# Patient Record
Sex: Female | Born: 1985 | Race: White | Hispanic: No | Marital: Single | Smoking: Never smoker
Health system: Southern US, Community
[De-identification: ages and names within clinical notes are randomized; demographics above are authoritative.]

## PROBLEM LIST (undated history)

## (undated) DIAGNOSIS — E119 Type 2 diabetes mellitus without complications: Secondary | ICD-10-CM

## (undated) DIAGNOSIS — N809 Endometriosis, unspecified: Secondary | ICD-10-CM

## (undated) DIAGNOSIS — T8859XA Other complications of anesthesia, initial encounter: Secondary | ICD-10-CM

## (undated) DIAGNOSIS — Z9889 Other specified postprocedural states: Secondary | ICD-10-CM

## (undated) DIAGNOSIS — R112 Nausea with vomiting, unspecified: Secondary | ICD-10-CM

## (undated) DIAGNOSIS — R011 Cardiac murmur, unspecified: Secondary | ICD-10-CM

## (undated) DIAGNOSIS — N200 Calculus of kidney: Secondary | ICD-10-CM

## (undated) DIAGNOSIS — Z8489 Family history of other specified conditions: Secondary | ICD-10-CM

## (undated) DIAGNOSIS — K219 Gastro-esophageal reflux disease without esophagitis: Secondary | ICD-10-CM

## (undated) DIAGNOSIS — T4145XA Adverse effect of unspecified anesthetic, initial encounter: Secondary | ICD-10-CM

## (undated) DIAGNOSIS — D649 Anemia, unspecified: Secondary | ICD-10-CM

## (undated) DIAGNOSIS — I10 Essential (primary) hypertension: Secondary | ICD-10-CM

## (undated) DIAGNOSIS — G43909 Migraine, unspecified, not intractable, without status migrainosus: Secondary | ICD-10-CM

## (undated) HISTORY — PX: CYST EXCISION: SHX5701

## (undated) HISTORY — PX: ABDOMINAL HYSTERECTOMY: SHX81

## (undated) HISTORY — PX: APPENDECTOMY: SHX54

---
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
## 2004-08-18 ENCOUNTER — Observation Stay: Payer: Self-pay | Admitting: Obstetrics and Gynecology

## 2004-10-08 ENCOUNTER — Inpatient Hospital Stay: Payer: Self-pay | Admitting: Obstetrics and Gynecology

## 2004-11-13 ENCOUNTER — Observation Stay: Payer: Self-pay

## 2004-12-10 ENCOUNTER — Inpatient Hospital Stay: Payer: Self-pay | Admitting: Obstetrics and Gynecology

## 2005-01-26 ENCOUNTER — Ambulatory Visit: Payer: Self-pay | Admitting: Urology

## 2005-03-29 ENCOUNTER — Emergency Department: Payer: Self-pay | Admitting: Internal Medicine

## 2005-04-02 ENCOUNTER — Emergency Department: Payer: Self-pay | Admitting: Emergency Medicine

## 2005-04-05 ENCOUNTER — Ambulatory Visit: Payer: Self-pay | Admitting: Obstetrics and Gynecology

## 2005-06-06 ENCOUNTER — Emergency Department: Payer: Self-pay | Admitting: Emergency Medicine

## 2005-07-14 ENCOUNTER — Emergency Department: Payer: Self-pay | Admitting: Emergency Medicine

## 2005-07-15 ENCOUNTER — Ambulatory Visit: Payer: Self-pay | Admitting: Emergency Medicine

## 2005-08-06 ENCOUNTER — Inpatient Hospital Stay: Payer: Self-pay | Admitting: Internal Medicine

## 2005-08-27 ENCOUNTER — Ambulatory Visit: Payer: Self-pay | Admitting: Gastroenterology

## 2005-08-31 ENCOUNTER — Ambulatory Visit: Payer: Self-pay | Admitting: Gastroenterology

## 2005-09-14 ENCOUNTER — Ambulatory Visit: Payer: Self-pay | Admitting: Obstetrics and Gynecology

## 2005-09-22 ENCOUNTER — Emergency Department: Payer: Self-pay | Admitting: Emergency Medicine

## 2005-10-05 ENCOUNTER — Ambulatory Visit: Payer: Self-pay | Admitting: Obstetrics and Gynecology

## 2005-11-12 ENCOUNTER — Ambulatory Visit: Payer: Self-pay | Admitting: Obstetrics and Gynecology

## 2006-01-06 ENCOUNTER — Ambulatory Visit: Payer: Self-pay | Admitting: Anesthesiology

## 2006-01-19 ENCOUNTER — Ambulatory Visit: Payer: Self-pay | Admitting: Anesthesiology

## 2006-02-15 ENCOUNTER — Ambulatory Visit: Payer: Self-pay | Admitting: Anesthesiology

## 2006-03-22 ENCOUNTER — Ambulatory Visit: Payer: Self-pay | Admitting: Anesthesiology

## 2006-05-12 ENCOUNTER — Ambulatory Visit: Payer: Self-pay | Admitting: Anesthesiology

## 2006-10-04 ENCOUNTER — Emergency Department: Payer: Self-pay | Admitting: Internal Medicine

## 2006-10-06 ENCOUNTER — Emergency Department: Payer: Self-pay | Admitting: Emergency Medicine

## 2006-11-15 ENCOUNTER — Emergency Department: Payer: Self-pay | Admitting: General Practice

## 2007-01-23 ENCOUNTER — Emergency Department: Payer: Self-pay | Admitting: Internal Medicine

## 2007-04-22 ENCOUNTER — Emergency Department: Payer: Self-pay | Admitting: Emergency Medicine

## 2007-04-28 ENCOUNTER — Ambulatory Visit (HOSPITAL_BASED_OUTPATIENT_CLINIC_OR_DEPARTMENT_OTHER): Admission: RE | Admit: 2007-04-28 | Discharge: 2007-04-28 | Payer: Self-pay | Admitting: Urology

## 2007-12-03 ENCOUNTER — Emergency Department: Payer: Self-pay | Admitting: Emergency Medicine

## 2008-03-15 ENCOUNTER — Observation Stay: Payer: Self-pay

## 2008-05-21 ENCOUNTER — Ambulatory Visit: Payer: Self-pay | Admitting: Unknown Physician Specialty

## 2008-05-26 ENCOUNTER — Observation Stay: Payer: Self-pay

## 2008-05-27 ENCOUNTER — Observation Stay: Payer: Self-pay

## 2008-06-10 ENCOUNTER — Encounter: Payer: Self-pay | Admitting: Maternal & Fetal Medicine

## 2008-06-17 ENCOUNTER — Encounter: Payer: Self-pay | Admitting: Obstetrics & Gynecology

## 2008-06-24 ENCOUNTER — Encounter: Payer: Self-pay | Admitting: Maternal and Fetal Medicine

## 2008-07-04 ENCOUNTER — Encounter: Payer: Self-pay | Admitting: Maternal & Fetal Medicine

## 2008-07-05 ENCOUNTER — Observation Stay: Payer: Self-pay | Admitting: Unknown Physician Specialty

## 2008-07-06 ENCOUNTER — Observation Stay: Payer: Self-pay

## 2008-07-11 ENCOUNTER — Encounter: Payer: Self-pay | Admitting: Obstetrics and Gynecology

## 2008-07-17 ENCOUNTER — Ambulatory Visit: Payer: Self-pay

## 2008-07-18 ENCOUNTER — Inpatient Hospital Stay: Payer: Self-pay

## 2008-10-09 ENCOUNTER — Ambulatory Visit: Payer: Self-pay

## 2008-10-31 ENCOUNTER — Ambulatory Visit: Payer: Self-pay | Admitting: Internal Medicine

## 2008-12-30 ENCOUNTER — Emergency Department: Payer: Self-pay | Admitting: Emergency Medicine

## 2009-06-18 ENCOUNTER — Emergency Department: Payer: Self-pay

## 2009-07-18 ENCOUNTER — Emergency Department: Payer: Self-pay | Admitting: Internal Medicine

## 2009-08-10 ENCOUNTER — Emergency Department: Payer: Self-pay | Admitting: Unknown Physician Specialty

## 2009-09-16 ENCOUNTER — Ambulatory Visit: Payer: Self-pay | Admitting: Internal Medicine

## 2009-12-02 ENCOUNTER — Emergency Department: Payer: Self-pay | Admitting: Emergency Medicine

## 2010-02-05 ENCOUNTER — Ambulatory Visit: Payer: Self-pay | Admitting: General Practice

## 2010-02-09 ENCOUNTER — Ambulatory Visit: Payer: Self-pay | Admitting: General Practice

## 2010-02-16 ENCOUNTER — Ambulatory Visit: Payer: Self-pay | Admitting: Urology

## 2010-02-19 ENCOUNTER — Ambulatory Visit: Payer: Self-pay | Admitting: Urology

## 2010-02-25 ENCOUNTER — Ambulatory Visit: Payer: Self-pay | Admitting: Urology

## 2010-10-19 ENCOUNTER — Emergency Department: Payer: Self-pay | Admitting: Internal Medicine

## 2010-11-30 ENCOUNTER — Ambulatory Visit: Payer: Self-pay

## 2011-01-26 NOTE — Op Note (Signed)
NAMEJENNIFER, Taylor Mercer                 ACCOUNT NO.:  [id number]   MEDICAL RECORD NO.:  [id number]          PATIENT TYPE:  AMB   LOCATION:  [facility]                         FACILITY:  [facility]   PHYSICIAN:  Jamison Neighbor, M.D.  DATE OF BIRTH:  09-05-1986   DATE OF PROCEDURE:  04/28/2007  DATE OF DISCHARGE:                               OPERATIVE REPORT   SERVICE:  Urology.   PREOPERATIVE DIAGNOSIS:  Interstitial cystitis.   POSTOPERATIVE DIAGNOSIS:  Interstitial cystitis.   PROCEDURE:  1. Cystoscopy.  2. Urethral calibration.  3. Hydrodistention of the bladder.  4. Marcaine and Pyridium installation.  5. Marcaine and Kenalog injection.   SURGEON:  Jamison Neighbor, M.D.   ANESTHESIA:  General.   COMPLICATIONS:  None.   DRAINS:  None.   BRIEF HISTORY:  This 25 year old female has lower urinary symptoms  including urgency, frequency and pelvic pain.  She also does have a past  history of endometriosis and has been evaluated in [facility] by Dr.  Saralyn Pilar.  The patient has had a potassium test which was felt to  positive.  She has never had a cystoscopy.  She is now to undergo  diagnostic cystoscopy and hydrodistention for evaluation of her bladder.  The patient understands the risks and benefits of the procedure and gave  full informed consent.   PROCEDURE:  After successful induction of general anesthesia, the  patient was placed in the dorsal position and prepped with Betadine and  draped in the usual sterile fashion.  Careful bimanual examination  revealed no irregularities of the urethra, specifically no signs a of  urethral diverticulum.  She had no cystocele, rectocele or enterocele.  There were no masses on bimanual exam.  The urethra was calibrated to 75-  [place] with female urethral sounds.  The cystoscope was inserted; the  bladder was carefully inspected.  No tumors or stones could be seen.  Both ureteral orifices were of normal configuration and location.  The  patient did have squamous metaplasia at the base of the bladder, which  was felt to be generally unremarkable.  The bladder was distended at a  pressure of 100 cmH2O for 5 minutes.  When the bladder was drained,  glomerulations could be seen in the bladder consistent with an  interstitial cystitis.  The bladder capacity of 800 mL was somewhat  diminished compared to a normal bladder capacity of 1150, but is better  than the average IC capacity of 161; this certainly is consistent with  the working diagnosis of IC.  The patient had cauterization of some  bleeding areas in the trigone, but did not have massive ulcers  otherwise requiring fulguration.  The cystoscope was removed.  The  patient had Marcaine and Pyridium injected into the bladder.  Marcaine  and Kenalog were injected periurethral.  The patient tolerated the  procedure well and was taken to the recovery room in good condition.      Jamison Neighbor, M.D.  Electronically Signed     RJE/MEDQ  D:  04/28/2007  T:  04/29/2007  Job:  [id number]  cc:   Madelin Headings  Fax: [phone]

## 2011-05-18 ENCOUNTER — Emergency Department: Payer: Self-pay | Admitting: Emergency Medicine

## 2011-06-22 ENCOUNTER — Ambulatory Visit: Payer: Self-pay | Admitting: Internal Medicine

## 2011-06-25 LAB — POCT HEMOGLOBIN-HEMACUE
Hemoglobin: 13.2
Operator id: 133231

## 2011-08-10 ENCOUNTER — Ambulatory Visit: Payer: Self-pay | Admitting: Internal Medicine

## 2011-10-12 DIAGNOSIS — I10 Essential (primary) hypertension: Secondary | ICD-10-CM | POA: Insufficient documentation

## 2012-04-12 ENCOUNTER — Ambulatory Visit: Payer: Self-pay | Admitting: Surgery

## 2012-04-13 ENCOUNTER — Ambulatory Visit: Payer: Self-pay | Admitting: Surgery

## 2012-12-19 ENCOUNTER — Encounter: Payer: Self-pay | Admitting: Urology

## 2013-01-11 ENCOUNTER — Encounter: Payer: Self-pay | Admitting: Urology

## 2013-02-11 ENCOUNTER — Encounter: Payer: Self-pay | Admitting: Urology

## 2013-02-15 ENCOUNTER — Emergency Department: Payer: Self-pay | Admitting: Emergency Medicine

## 2013-03-13 ENCOUNTER — Encounter: Payer: Self-pay | Admitting: Urology

## 2013-06-27 ENCOUNTER — Emergency Department: Payer: Self-pay | Admitting: Emergency Medicine

## 2013-06-27 LAB — COMPREHENSIVE METABOLIC PANEL
Albumin: 3.8 g/dL (ref 3.4–5.0)
Anion Gap: 6 — ABNORMAL LOW (ref 7–16)
BUN: 11 mg/dL (ref 7–18)
Chloride: 102 mmol/L (ref 98–107)
Co2: 29 mmol/L (ref 21–32)
EGFR (Non-African Amer.): >60
Glucose: 105 mg/dL — ABNORMAL HIGH (ref 65–99)
Osmolality: 274 (ref 275–301)
Potassium: 3.5 mmol/L (ref 3.5–5.1)
SGPT (ALT): 28 U/L (ref 12–78)

## 2013-06-27 LAB — CBC
HCT: 41 % (ref 35.0–47.0)
HGB: 14.8 g/dL (ref 12.0–16.0)
MCHC: 36.2 g/dL — ABNORMAL HIGH (ref 32.0–36.0)
MCV: 84 fL (ref 80–100)
Platelet: 194 10*3/uL (ref 150–440)
RDW: 13.3 % (ref 11.5–14.5)

## 2013-07-02 LAB — CULTURE, BLOOD (SINGLE)

## 2013-09-10 ENCOUNTER — Ambulatory Visit: Payer: Self-pay | Admitting: Physician Assistant

## 2013-09-13 ENCOUNTER — Emergency Department: Payer: Self-pay | Admitting: Emergency Medicine

## 2014-01-21 ENCOUNTER — Emergency Department: Payer: Self-pay | Admitting: Emergency Medicine

## 2014-01-21 LAB — URINALYSIS, COMPLETE
Bacteria: NONE SEEN
Bilirubin,UR: NEGATIVE
Blood: NEGATIVE
GLUCOSE, UR: NEGATIVE mg/dL (ref 0–75)
Leukocyte Esterase: NEGATIVE
NITRITE: NEGATIVE
PH: 5 (ref 4.5–8.0)
RBC,UR: <1 /HPF (ref 0–5)
Specific Gravity: 1.023 (ref 1.003–1.030)
WBC UR: 8 /HPF (ref 0–5)

## 2014-02-07 DIAGNOSIS — S39012A Strain of muscle, fascia and tendon of lower back, initial encounter: Secondary | ICD-10-CM | POA: Insufficient documentation

## 2014-03-27 ENCOUNTER — Ambulatory Visit: Payer: Self-pay | Admitting: Nurse Practitioner

## 2014-04-05 ENCOUNTER — Emergency Department: Payer: Self-pay | Admitting: Internal Medicine

## 2014-06-28 ENCOUNTER — Ambulatory Visit: Payer: Self-pay | Admitting: Family Medicine

## 2014-09-27 ENCOUNTER — Emergency Department: Payer: Self-pay | Admitting: Emergency Medicine

## 2015-02-07 ENCOUNTER — Encounter: Payer: Self-pay | Admitting: Emergency Medicine

## 2015-02-07 ENCOUNTER — Ambulatory Visit
Admission: EM | Admit: 2015-02-07 | Discharge: 2015-02-07 | Disposition: A | Discharge status: Home / Self Care | Payer: BLUE CROSS/BLUE SHIELD | Attending: Family Medicine | Admitting: Family Medicine

## 2015-02-07 DIAGNOSIS — H109 Unspecified conjunctivitis: Secondary | ICD-10-CM | POA: Diagnosis not present

## 2015-02-07 DIAGNOSIS — I10 Essential (primary) hypertension: Secondary | ICD-10-CM

## 2015-02-07 DIAGNOSIS — M778 Other enthesopathies, not elsewhere classified: Secondary | ICD-10-CM

## 2015-02-07 DIAGNOSIS — H65111 Acute and subacute allergic otitis media (mucoid) (sanguinous) (serous), right ear: Secondary | ICD-10-CM

## 2015-02-07 HISTORY — DX: Calculus of kidney: N20.0

## 2015-02-07 HISTORY — DX: Essential (primary) hypertension: I10

## 2015-02-07 HISTORY — DX: Migraine, unspecified, not intractable, without status migrainosus: G43.909

## 2015-02-07 MED ORDER — ERYTHROMYCIN 5 MG/GM OP OINT: TOPICAL_OINTMENT | OPHTHALMIC | Status: DC

## 2015-02-07 MED ORDER — AMOXICILLIN-POT CLAVULANATE 875-125 MG PO TABS: 1.0000 | ORAL_TABLET | Freq: Two times a day (BID) | ORAL | Status: DC

## 2015-02-07 MED ORDER — CHLORTHALIDONE 25 MG PO TABS: 25.0000 mg | ORAL_TABLET | Freq: Every day | ORAL | Status: DC

## 2015-02-07 NOTE — ED Provider Notes (Signed)
CSN: [id number]     Arrival date & time 02/07/15  0902 History   First MD Initiated Contact with Patient 02/07/15 1111     Chief Complaint  Patient presents with  . Eye Drainage    right eye  . Itchy Eye    right eye   (Consider location/radiation/quality/duration/timing/severity/associated sxs/prior Treatment) HPI Comments: Caucasian female here today for pink eye/discharge right eye, blurry vision right eye, itching worsening over the past 3 days.  Eye was matted shut this am.  She has been helping her mother to move unexpectedly this week.  Repetitive movements/lifting heavy items and right elbow has begun to hurt her also.  Bought OTC brace and wondering if she should use it all the time.  [facility] [facility] [place] [facility] and first available appt end of June and patient has run out of her blood pressure medication and needs refill.  Denied headache, dizzyness, nausea, vomiting, diarrhea.  Son has been sick, stressed trying to help her mother move.  The history is provided by the patient.    Past Medical History  Diagnosis Date  . Hypertension   . Kidney stones   . Migraine    Past Surgical History  Procedure Laterality Date  . Cesarean section    . Abdominal hysterectomy    . Appendectomy     Family History  Problem Relation Age of Onset  . Hypertension Mother   . Cancer Father    History  Substance Use Topics  . Smoking status: Never Smoker   . Smokeless tobacco: Never Used  . Alcohol Use: No   OB History    No data available     Review of Systems  Constitutional: Negative for fever, chills, diaphoresis, activity change, appetite change, fatigue and unexpected weight change.  HENT: Negative for congestion, dental problem, drooling, ear discharge, ear pain, facial swelling, hearing loss, mouth sores, nosebleeds, postnasal drip, rhinorrhea, sinus pressure, sneezing, sore throat, tinnitus, trouble swallowing and voice change.   Eyes: Positive for discharge,  redness, itching and visual disturbance. Negative for photophobia and pain.  Respiratory: Negative for cough, chest tightness, shortness of breath, wheezing and stridor.   Cardiovascular: Negative for chest pain, palpitations and leg swelling.  Gastrointestinal: Negative for nausea, abdominal pain, diarrhea, constipation, blood in stool and abdominal distention.  Endocrine: Negative for cold intolerance and heat intolerance.  Genitourinary: Negative for dysuria and difficulty urinating.  Musculoskeletal: Positive for arthralgias. Negative for myalgias, back pain, joint swelling, gait problem, neck pain and neck stiffness.  Skin: Negative for color change, pallor, rash and wound.  Allergic/Immunologic: Positive for environmental allergies. Negative for food allergies.  Neurological: Negative for tremors, seizures, syncope, speech difficulty, weakness, light-headedness, numbness and headaches.  Hematological: Negative for adenopathy. Does not bruise/bleed easily.  Psychiatric/Behavioral: Negative for behavioral problems, confusion, sleep disturbance and agitation.    Allergies  Review of patient's allergies indicates no known allergies.  Home Medications   Prior to Admission medications   Medication Sig Start Date End Date Taking? Authorizing Provider  fluticasone (FLONASE) 50 MCG/ACT nasal spray Place 1 spray into both nostrils daily.   Yes Historical Provider, MD  amoxicillin-clavulanate (AUGMENTIN) 875-125 MG per tablet Take 1 tablet by mouth every 12 (twelve) hours. 02/07/15   Barbaraann Barthelina A Iven Earnhart, NP  chlorthalidone (HYGROTON) 25 MG tablet Take 1 tablet (25 mg total) by mouth daily. 02/07/15   Barbaraann Barthelina A Kane Kusek, NP  erythromycin ophthalmic ointment Place a 1/2 inch ribbon of ointment into the lower  right eyelid for 14 days twice a day. 02/07/15   Jarold Song Jasmen Emrich, NP   BP 163/120 mmHg  Pulse 104  Temp(Src) 97.5 F (36.4 C) (Tympanic)  Resp 16  Ht  (1.626 m)  Wt 215 lb (97.523 kg)   BMI 36.89 kg/m2  SpO2 100% Physical Exam  Constitutional: She is oriented to person, place, and time. Vital signs are normal. She appears well-developed and well-nourished.  HENT:  Head: Normocephalic and atraumatic.  Right Ear: Hearing, tympanic membrane, external ear and ear canal normal.  Left Ear: Hearing and external ear normal. Tympanic membrane is erythematous. A middle ear effusion is present.  Nose: Nose normal.  Mouth/Throat: Oropharynx is clear and moist. No oropharyngeal exudate.  Left TM erythema/air fluid level cloudy external canal with erythema dry; right TM air fluid level clear  Eyes: EOM and lids are normal. Pupils are equal, round, and reactive to light. Right eye exhibits no chemosis, no discharge, no exudate and no hordeolum. No foreign body present in the right eye. Left eye exhibits no chemosis, no discharge, no exudate and no hordeolum. No foreign body present in the left eye. Right conjunctiva is injected. Right conjunctiva has no hemorrhage. Left conjunctiva is injected. Left conjunctiva has no hemorrhage. No scleral icterus. Right eye exhibits normal extraocular motion and no nystagmus. Left eye exhibits normal extraocular motion and no nystagmus. Right pupil is round and reactive. Left pupil is round and reactive. Pupils are equal.    Neck: Trachea normal and normal range of motion. Neck supple. No JVD present. No tracheal deviation present.  Cardiovascular: Normal rate, regular rhythm, normal heart sounds and intact distal pulses.  Exam reveals no gallop and no friction rub.   No murmur heard. Pulmonary/Chest: Effort normal and breath sounds normal. No respiratory distress. She has no wheezes. She has no rales. She exhibits no tenderness.  Abdominal: Soft. Bowel sounds are normal. She exhibits no distension and no mass. There is no tenderness. There is no rebound and no guarding.  Musculoskeletal: Normal range of motion. She exhibits tenderness. She exhibits no edema.        Right elbow: She exhibits normal range of motion, no swelling, no effusion, no deformity and no laceration. Tenderness found. Lateral epicondyle tenderness noted. No radial head, no medial epicondyle and no olecranon process tenderness noted.       Arms: Lymphadenopathy:    She has no cervical adenopathy.  Neurological: She is alert and oriented to person, place, and time. She has normal reflexes.  Skin: Skin is warm, dry and intact. No rash noted. No erythema. No pallor.  Psychiatric: She has a normal mood and affect. Her speech is normal and behavior is normal. Judgment and thought content normal. Cognition and memory are normal.  Nursing note and vitals reviewed.   ED Course  Procedures (including critical care time) Labs Review Labs Reviewed - No data to display  Imaging Review No results found.   MDM   1. Essential hypertension   2. Conjunctivitis of right eye   3. Acute allergic otitis media of right ear, recurrence not specified   4. Right elbow tendonitis    Restart chlorthlidone  po daily.  Schedule initial appt with new PCM.  Follow up with me/emergency room if headache, shortness of breath, chest pain, swelling of hands/feet, visual changes prior to [facility] appt.   Continue to monitor blood pressure at home and maintain log of blood pressure and pulse to bring to follow up appointments.  Continue low sodium diet and exercise program.  Recommended weight loss/weight maintenance to BMI 20-25.  Return to the clinic if any new symptoms.  Patient verbalized agreement and understanding of treatment plan and had no further questions at this time.   P2:  Diet and Exercise specific for HTN Work excuse for today as unsure if allergic versus bacterial conjunctivitis.  Will treat with erythromycin opthalmic right eye BID x 14 days.  Continue OTC allergy eye gtts prn or refresh gtts while awake prn.    Hygiene discussed. Patient to apply warm packs prn as directed.  Instructed  patient to not rub eyes.  May need to wash pillowcases more frequently until infection resolves.  May use over the counter eye drops/tears for pain/symptom relief.  Return to clinic if headache, fever greater than 100.40F, nausea/vomiting, purulent discharge/matting unable to open eye without using fingers after 24 hours of medication use, foreign body sensation, ciliary flush, worsening photophobia or vision.  Call or return to clinic as needed if these symptoms worsen or fail to improve as anticipated.  Patient given [employer] handout on bacterial conjunctivitis.  Patient verbalized agreement and understanding of treatment plan.  P2:  Hand washing, avoid contact use-wear glasses. Given augmentin paper prescription as patient not having discomfort at this time but if pain occurs to start antibiotic this weekend.  Treatment as ordered.  Symptomatic therapy suggested fluids, NSAIDs and rest.  Call or return to clinic as needed if these symptoms worsen or fail to improve as anticipated. [employer] handout on otitis media given to patient.  Patient verbalized agreement and understanding of treatment plan.   P2:  Hand washing Patient given [employer] handout on elbow tendonitis.  Repetitive motion injury.  Discussed work restrictions may restart using brace she bought.  Try to limit repetitive motions at home and work.  Discussed decreasing total pounds of weights being used (do not increase more than 10% per week either total pounds or repetitions), and icing.  Patient will schedule follow up with [facility] for Physical Therapy referral if needed  Discussed tylenol  po QID recommended as does not worsen elevated BP but motrin/naproxen can.  Follow up with PCM if no improvement in symptoms with plan of care.  Will consider orthopedics and x-rays if fails physical therapy.  Patient verbalized understanding of instructions, agreed with plan of care and had no further questions at this time.    Barbaraann Barthel,  NP 02/07/15 1725

## 2015-02-07 NOTE — Discharge Instructions (Signed)
Conjunctivitis Conjunctivitis is commonly called "pink eye." Conjunctivitis can be caused by bacterial or viral infection, allergies, or injuries. There is usually redness of the lining of the eye, itching, discomfort, and sometimes discharge. There may be deposits of matter along the eyelids. A viral infection usually causes a watery discharge, while a bacterial infection causes a yellowish, thick discharge. Pink eye is very contagious and spreads by direct contact. You may be given antibiotic eyedrops as part of your treatment. Before using your eye medicine, remove all drainage from the eye by washing gently with warm water and cotton balls. Continue to use the medication until you have awakened 2 mornings in a row without discharge from the eye. Do not rub your eye. This increases the irritation and helps spread infection. Use separate towels from other household members. Wash your hands with soap and water before and after touching your eyes. Use cold compresses to reduce pain and sunglasses to relieve irritation from light. Do not wear contact lenses or wear eye makeup until the infection is gone. SEEK MEDICAL CARE IF:   Your symptoms are not better after 3 days of treatment.  You have increased pain or trouble seeing.  The outer eyelids become very red or swollen. Document Released: 10/07/2004 Document Revised: 11/22/2011 Document Reviewed: 08/30/2005 [facility] Patient Information 2015 [place]. This information is not intended to replace advice given to you by your health care provider. Make sure you discuss any questions you have with your health care provider. Otitis Media Otitis media is redness, soreness, and inflammation of the middle ear. Otitis media may be caused by allergies or, most commonly, by infection. Often it occurs as a complication of the common cold. SIGNS AND SYMPTOMS Symptoms of otitis media may include:  Earache.  Fever.  Ringing in your  ear.  Headache.  Leakage of fluid from the ear. DIAGNOSIS To diagnose otitis media, your health care provider will examine your ear with an otoscope. This is an instrument that allows your health care provider to see into your ear in order to examine your eardrum. Your health care provider also will ask you questions about your symptoms. TREATMENT  Typically, otitis media resolves on its own within 3-5 days. Your health care provider may prescribe medicine to ease your symptoms of pain. If otitis media does not resolve within 5 days or is recurrent, your health care provider may prescribe antibiotic medicines if he or she suspects that a bacterial infection is the cause. HOME CARE INSTRUCTIONS   If you were prescribed an antibiotic medicine, finish it all even if you start to feel better.  Take medicines only as directed by your health care provider.  Keep all follow-up visits as directed by your health care provider. SEEK MEDICAL CARE IF:  You have otitis media only in one ear, or bleeding from your nose, or both.  You notice a lump on your neck.  You are not getting better in 3-5 days.  You feel worse instead of better. SEEK IMMEDIATE MEDICAL CARE IF:   You have pain that is not controlled with medicine.  You have swelling, redness, or pain around your ear or stiffness in your neck.  You notice that part of your face is paralyzed.  You notice that the bone behind your ear (mastoid) is tender when you touch it. MAKE SURE YOU:   Understand these instructions.  Will watch your condition.  Will get help right away if you are not doing well or get worse.  Document Released: 06/04/2004 Document Revised: 01/14/2014 Document Reviewed: 03/27/2013 [facility] Patient Information 2015 [place], [place]. This information is not intended to replace advice given to you by your health care provider. Make sure you discuss any questions you have with your health care  provider. Hypertension Hypertension, commonly called high blood pressure, is when the force of blood pumping through your arteries is too strong. Your arteries are the blood vessels that carry blood from your heart throughout your body. A blood pressure reading consists of a higher number over a lower number, such as 110/72. The higher number (systolic) is the pressure inside your arteries when your heart pumps. The lower number (diastolic) is the pressure inside your arteries when your heart relaxes. Ideally you want your blood pressure below 120/80. Hypertension forces your heart to work harder to pump blood. Your arteries may become narrow or stiff. Having hypertension puts you at risk for heart disease, stroke, and other problems.  RISK FACTORS Some risk factors for high blood pressure are controllable. Others are not.  Risk factors you cannot control include:   Race. You may be at higher risk if you are African American.  Age. Risk increases with age.  Gender. Men are at higher risk than women before age 82 years. After age 48, women are at higher risk than men. Risk factors you can control include:  Not getting enough exercise or physical activity.  Being overweight.  Getting too much fat, sugar, calories, or salt in your diet.  Drinking too much alcohol. SIGNS AND SYMPTOMS Hypertension does not usually cause signs or symptoms. Extremely high blood pressure (hypertensive crisis) may cause headache, anxiety, shortness of breath, and nosebleed. DIAGNOSIS  To check if you have hypertension, your health care provider will measure your blood pressure while you are seated, with your arm held at the level of your heart. It should be measured at least twice using the same arm. Certain conditions can cause a difference in blood pressure between your right and left arms. A blood pressure reading that is higher than normal on one occasion does not mean that you need treatment. If one blood  pressure reading is high, ask your health care provider about having it checked again. TREATMENT  Treating high blood pressure includes making lifestyle changes and possibly taking medicine. Living a healthy lifestyle can help lower high blood pressure. You may need to change some of your habits. Lifestyle changes may include:  Following the DASH diet. This diet is high in fruits, vegetables, and whole grains. It is low in salt, red meat, and added sugars.  Getting at least 2 hours of brisk physical activity every week.  Losing weight if necessary.  Not smoking.  Limiting alcoholic beverages.  Learning ways to reduce stress. If lifestyle changes are not enough to get your blood pressure under control, your health care provider may prescribe medicine. You may need to take more than one. Work closely with your health care provider to understand the risks and benefits. HOME CARE INSTRUCTIONS  Have your blood pressure rechecked as directed by your health care provider.   Take medicines only as directed by your health care provider. Follow the directions carefully. Blood pressure medicines must be taken as prescribed. The medicine does not work as well when you skip doses. Skipping doses also puts you at risk for problems.   Do not smoke.   Monitor your blood pressure at home as directed by your health care provider. SEEK MEDICAL CARE IF:  You think you are having a reaction to medicines taken.  You have recurrent headaches or feel dizzy.  You have swelling in your ankles.  You have trouble with your vision. SEEK IMMEDIATE MEDICAL CARE IF:  You develop a severe headache or confusion.  You have unusual weakness, numbness, or feel faint.  You have severe chest or abdominal pain.  You vomit repeatedly.  You have trouble breathing. MAKE SURE YOU:   Understand these instructions.  Will watch your condition.  Will get help right away if you are not doing well or get  worse. Document Released: 08/30/2005 Document Revised: 01/14/2014 Document Reviewed: 06/22/2013 [facility] Patient Information 2015 [place], [place]. This information is not intended to replace advice given to you by your health care provider. Make sure you discuss any questions you have with your health care provider. Tendinitis Tendinitis is swelling and inflammation of the tendons. Tendons are band-like tissues that connect muscle to bone. Tendinitis commonly occurs in the:   Shoulders (rotator cuff).  Heels (Achilles tendon).  Elbows (triceps tendon). CAUSES Tendinitis is usually caused by overusing the tendon, muscles, and joints involved. When the tissue surrounding a tendon (synovium) becomes inflamed, it is called tenosynovitis. Tendinitis commonly develops in people whose jobs require repetitive motions. SYMPTOMS  Pain.  Tenderness.  Mild swelling. DIAGNOSIS Tendinitis is usually diagnosed by physical exam. Your health care provider may also order X-rays or other imaging tests. TREATMENT Your health care provider may recommend certain medicines or exercises for your treatment. HOME CARE INSTRUCTIONS   Use a sling or splint for as long as directed by your health care provider until the pain decreases.  Put ice on the injured area.  Put ice in a plastic bag.  Place a towel between your skin and the bag.  Leave the ice on for 15-20 minutes, 3-4 times a day, or as directed by your health care provider.  Avoid using the limb while the tendon is painful. Perform gentle range of motion exercises only as directed by your health care provider. Stop exercises if pain or discomfort increase, unless directed otherwise by your health care provider.  Only take over-the-counter or prescription medicines for pain, discomfort, or fever as directed by your health care provider. SEEK MEDICAL CARE IF:   Your pain and swelling increase.  You develop new, unexplained symptoms, especially  increased numbness in the hands. MAKE SURE YOU:   Understand these instructions.  Will watch your condition.  Will get help right away if you are not doing well or get worse. Document Released: 08/27/2000 Document Revised: 01/14/2014 Document Reviewed: 11/16/2010 [facility] Patient Information 2015 [place], [place]. This information is not intended to replace advice given to you by your health care provider. Make sure you discuss any questions you have with your health care provider.

## 2015-02-07 NOTE — ED Notes (Signed)
Patient c/o redness, itching, and drainage in her R eye for 3 days.  Patient denies fevers.

## 2015-03-13 ENCOUNTER — Ambulatory Visit
Admission: EM | Admit: 2015-03-13 | Discharge: 2015-03-13 | Disposition: A | Discharge status: Home / Self Care | Payer: BLUE CROSS/BLUE SHIELD | Attending: Family Medicine | Admitting: Family Medicine

## 2015-03-13 ENCOUNTER — Encounter: Payer: Self-pay | Admitting: Emergency Medicine

## 2015-03-13 DIAGNOSIS — J0100 Acute maxillary sinusitis, unspecified: Secondary | ICD-10-CM

## 2015-03-13 MED ORDER — DOXYCYCLINE HYCLATE 100 MG PO CAPS: 100.0000 mg | ORAL_CAPSULE | Freq: Two times a day (BID) | ORAL | Status: DC

## 2015-03-13 MED ORDER — PREDNISONE 10 MG PO TABS: ORAL_TABLET | ORAL | Status: DC

## 2015-03-13 NOTE — Discharge Instructions (Signed)
Keep taking the Flonase and ibuprofen for your symptoms. Start taking daily Zyrtec, and also start taking the prescribed prednisone. If you are getting worse still for the next 3-4 days, start the antibiotics. Also, if you are not getting any better after another week then he may also start the antibiotics   Sinusitis Sinusitis is redness, soreness, and inflammation of the paranasal sinuses. Paranasal sinuses are air pockets within the bones of your face (beneath the eyes, the middle of the forehead, or above the eyes). In healthy paranasal sinuses, mucus is able to drain out, and air is able to circulate through them by way of your nose. However, when your paranasal sinuses are inflamed, mucus and air can become trapped. This can allow bacteria and other germs to grow and cause infection. Sinusitis can develop quickly and last only a short time (acute) or continue over a long period (chronic). Sinusitis that lasts for more than 12 weeks is considered chronic.  CAUSES  Causes of sinusitis include:  Allergies.  Structural abnormalities, such as displacement of the cartilage that separates your nostrils (deviated septum), which can decrease the air flow through your nose and sinuses and affect sinus drainage.  Functional abnormalities, such as when the small hairs (cilia) that line your sinuses and help remove mucus do not work properly or are not present. SIGNS AND SYMPTOMS  Symptoms of acute and chronic sinusitis are the same. The primary symptoms are pain and pressure around the affected sinuses. Other symptoms include:  Upper toothache.  Earache.  Headache.  Bad breath.  Decreased sense of smell and taste.  A cough, which worsens when you are lying flat.  Fatigue.  Fever.  Thick drainage from your nose, which often is green and may contain pus (purulent).  Swelling and warmth over the affected sinuses. DIAGNOSIS  Your health care provider will perform a physical exam. During  the exam, your health care provider may:  Look in your nose for signs of abnormal growths in your nostrils (nasal polyps).  Tap over the affected sinus to check for signs of infection.  View the inside of your sinuses (endoscopy) using an imaging device that has a light attached (endoscope). If your health care provider suspects that you have chronic sinusitis, one or more of the following tests may be recommended:  Allergy tests.  Nasal culture. A sample of mucus is taken from your nose, sent to a lab, and screened for bacteria.  Nasal cytology. A sample of mucus is taken from your nose and examined by your health care provider to determine if your sinusitis is related to an allergy. TREATMENT  Most cases of acute sinusitis are related to a viral infection and will resolve on their own within 10 days. Sometimes medicines are prescribed to help relieve symptoms (pain medicine, decongestants, nasal steroid sprays, or saline sprays).  However, for sinusitis related to a bacterial infection, your health care provider will prescribe antibiotic medicines. These are medicines that will help kill the bacteria causing the infection.  Rarely, sinusitis is caused by a fungal infection. In theses cases, your health care provider will prescribe antifungal medicine. For some cases of chronic sinusitis, surgery is needed. Generally, these are cases in which sinusitis recurs more than 3 times per year, despite other treatments. HOME CARE INSTRUCTIONS   Drink plenty of water. Water helps thin the mucus so your sinuses can drain more easily.  Use a humidifier.  Inhale steam 3 to 4 times a day (for example, sit  in the bathroom with the shower running).  Apply a warm, moist washcloth to your face 3 to 4 times a day, or as directed by your health care provider.  Use saline nasal sprays to help moisten and clean your sinuses.  Take medicines only as directed by your health care provider.  If you were  prescribed either an antibiotic or antifungal medicine, finish it all even if you start to feel better. SEEK IMMEDIATE MEDICAL CARE IF:  You have increasing pain or severe headaches.  You have nausea, vomiting, or drowsiness.  You have swelling around your face.  You have vision problems.  You have a stiff neck.  You have difficulty breathing. MAKE SURE YOU:   Understand these instructions.  Will watch your condition.  Will get help right away if you are not doing well or get worse. Document Released: 08/30/2005 Document Revised: 01/14/2014 Document Reviewed: 09/14/2011 [facility] Patient Information 2015 [place], [place]. This information is not intended to replace advice given to you by your health care provider. Make sure you discuss any questions you have with your health care provider.

## 2015-03-13 NOTE — ED Notes (Signed)
Patient c/o pain in her right ear for the past 2 days.  Patient reports nasal congestion. Patient denies fevers.

## 2015-03-13 NOTE — ED Provider Notes (Signed)
CSN: [id number]     Arrival date & time 03/13/15  7829 History   First MD Initiated Contact with Patient 03/13/15 [phone]     Chief Complaint  Patient presents with  . Otalgia    right   (Consider location/radiation/quality/duration/timing/severity/associated sxs/prior Treatment) HPI 29 year old female presents for evaluation of right-sided ear pain and right-sided maxillary sinus pain. This started 2-3 days ago. It is associated with right-sided nasal congestion and rhinorrhea. She has been taking Flonase and ibuprofen for symptoms with minimal relief. She has a history of a recent sinus infection on the left, she took Augmentin for this. She denies fever, chills, cough, sore throat, or any other systemic symptoms  Past Medical History  Diagnosis Date  . Hypertension   . Kidney stones   . Migraine    Past Surgical History  Procedure Laterality Date  . Cesarean section    . Abdominal hysterectomy    . Appendectomy     Family History  Problem Relation Age of Onset  . Hypertension Mother   . Cancer Father    History  Substance Use Topics  . Smoking status: Never Smoker   . Smokeless tobacco: Never Used  . Alcohol Use: No   OB History    No data available     Review of Systems  Constitutional: Negative for fever and chills.  HENT: Positive for congestion, ear pain, rhinorrhea and sinus pressure. Negative for ear discharge, postnasal drip and sore throat.   Respiratory: Negative for cough.   Gastrointestinal: Negative for nausea and vomiting.  All other systems reviewed and are negative.   Allergies  Review of patient's allergies indicates no known allergies.  Home Medications   Prior to Admission medications   Medication Sig Start Date End Date Taking? Authorizing Provider  amoxicillin-clavulanate (AUGMENTIN) 875-125 MG per tablet Take 1 tablet by mouth every 12 (twelve) hours. 02/07/15   Barbaraann Barthel, NP  chlorthalidone (HYGROTON) 25 MG tablet Take 1 tablet (25 mg  total) by mouth daily. 02/07/15   Barbaraann Barthel, NP  doxycycline (VIBRAMYCIN) 100 MG capsule Take 1 capsule (100 mg total) by mouth 2 (two) times daily. 03/13/15   Graylon Good, PA-C  erythromycin ophthalmic ointment Place a 1/2 inch ribbon of ointment into the lower right eyelid for 14 days twice a day. 02/07/15   Barbaraann Barthel, NP  fluticasone (FLONASE) 50 MCG/ACT nasal spray Place 1 spray into both nostrils daily.    Historical Provider, MD  predniSONE (DELTASONE) 10 MG tablet 4 tabs PO QD for 4 days; 3 tabs PO QD for 3 days; 2 tabs PO QD for 2 days; 1 tab PO QD for 1 day 03/13/15   Graylon Good, PA-C   BP 151/97 mmHg  Pulse 74  Temp(Src) 98.6 F (37 C) (Oral)  Resp 16  Ht  (1.6 m)  Wt 215 lb (97.523 kg)  BMI 38.09 kg/m2  SpO2 100% Physical Exam  Constitutional: She is oriented to person, place, and time. Vital signs are normal. She appears well-developed and well-nourished. No distress.  HENT:  Head: Normocephalic and atraumatic.  Right Ear: Tympanic membrane, external ear and ear canal normal.  Left Ear: Tympanic membrane, external ear and ear canal normal.  Nose: Right sinus exhibits maxillary sinus tenderness. Right sinus exhibits no frontal sinus tenderness. Left sinus exhibits no maxillary sinus tenderness and no frontal sinus tenderness.  Mouth/Throat: Uvula is midline, oropharynx is clear and moist and mucous membranes are normal. No oropharyngeal  exudate.  Neck: Normal range of motion. Neck supple.  Cardiovascular: Normal rate, regular rhythm and normal heart sounds.   Pulmonary/Chest: Effort normal and breath sounds normal. No respiratory distress.  Lymphadenopathy:    She has no cervical adenopathy.  Neurological: She is alert and oriented to person, place, and time. She has normal strength. Coordination normal.  Skin: Skin is warm and dry. No rash noted. She is not diaphoretic.  Psychiatric: She has a normal mood and affect. Judgment normal.  Nursing note  and vitals reviewed.   ED Course  Procedures (including critical care time) Labs Review Labs Reviewed - No data to display  Imaging Review No results found.   MDM   1. Acute maxillary sinusitis, recurrence not specified    Symptoms are consistent with sinusitis. She is afebrile and nontoxic with moderate pain and tenderness. Attempt treatment without anabiotic's. We will add prednisone to her treatment and she will start Zyrtec at home. If this is not helping in a few days or she is worsening and then she will start treatment with doxycycline. Follow-up when necessary if worsening   Meds ordered this encounter  Medications  . doxycycline (VIBRAMYCIN) 100 MG capsule    Sig: Take 1 capsule (100 mg total) by mouth 2 (two) times daily.    Dispense:  20 capsule    Refill:  0  . predniSONE (DELTASONE) 10 MG tablet    Sig: 4 tabs PO QD for 4 days; 3 tabs PO QD for 3 days; 2 tabs PO QD for 2 days; 1 tab PO QD for 1 day    Dispense:  30 tablet    Refill:  0       Graylon GoodZachary H Luverne Zerkle, PA-C 03/13/15 [phone]

## 2015-07-16 ENCOUNTER — Emergency Department
Admission: EM | Admit: 2015-07-16 | Discharge: 2015-07-16 | Disposition: A | Discharge status: Home / Self Care | Payer: BLUE CROSS/BLUE SHIELD | Attending: Emergency Medicine | Admitting: Emergency Medicine

## 2015-07-16 ENCOUNTER — Emergency Department: Payer: BLUE CROSS/BLUE SHIELD

## 2015-07-16 ENCOUNTER — Encounter: Payer: Self-pay | Admitting: Emergency Medicine

## 2015-07-16 DIAGNOSIS — Z7952 Long term (current) use of systemic steroids: Secondary | ICD-10-CM | POA: Insufficient documentation

## 2015-07-16 DIAGNOSIS — Z792 Long term (current) use of antibiotics: Secondary | ICD-10-CM | POA: Diagnosis not present

## 2015-07-16 DIAGNOSIS — Y9389 Activity, other specified: Secondary | ICD-10-CM | POA: Insufficient documentation

## 2015-07-16 DIAGNOSIS — M7732 Calcaneal spur, left foot: Secondary | ICD-10-CM | POA: Diagnosis not present

## 2015-07-16 DIAGNOSIS — Z79899 Other long term (current) drug therapy: Secondary | ICD-10-CM | POA: Insufficient documentation

## 2015-07-16 DIAGNOSIS — S99922A Unspecified injury of left foot, initial encounter: Secondary | ICD-10-CM | POA: Diagnosis present

## 2015-07-16 DIAGNOSIS — Y9289 Other specified places as the place of occurrence of the external cause: Secondary | ICD-10-CM | POA: Insufficient documentation

## 2015-07-16 DIAGNOSIS — X58XXXA Exposure to other specified factors, initial encounter: Secondary | ICD-10-CM | POA: Insufficient documentation

## 2015-07-16 DIAGNOSIS — I10 Essential (primary) hypertension: Secondary | ICD-10-CM | POA: Diagnosis not present

## 2015-07-16 DIAGNOSIS — Y990 Civilian activity done for income or pay: Secondary | ICD-10-CM | POA: Insufficient documentation

## 2015-07-16 MED ORDER — IBUPROFEN 600 MG PO TABS
600.0000 mg | ORAL_TABLET | Freq: Once | ORAL | Status: AC
  Administered 2015-07-16: 600 mg via ORAL
  Filled 2015-07-16: qty 1

## 2015-07-16 MED ORDER — CHLORTHALIDONE 25 MG PO TABS: 25.0000 mg | ORAL_TABLET | Freq: Every day | ORAL | Status: AC

## 2015-07-16 NOTE — ED Notes (Signed)
Pt ambulated with steady gait and without signs of difficulty.

## 2015-07-16 NOTE — ED Notes (Signed)
Pt verbalized understanding of discharge instructions. NAD at this time. 

## 2015-07-16 NOTE — ED Notes (Signed)
Pt states that yesterday she was reaching up high and standing on her toes, heard something pop. Pain is in her left foot- left heel and outside of left ankle. Pt has used ice, ibuprofen, and aleeve. States that it helps until it wears off. NAD or swelling noted. Bottom of heel is red.

## 2015-07-16 NOTE — Discharge Instructions (Signed)
Heel Spur  A heel spur is a bony growth that forms on the bottom of your heel bone (calcaneus). Heel spurs are common and do not always cause pain. However, heel spurs often cause inflammation in the strong band of tissue that runs underneath the bone of your foot (plantar fascia). When this happens, you may feel pain on the bottom of your foot, near your heel.   CAUSES   The cause of heel spurs is not completely understood. They may be caused by pressure on the heel. Or, they may stem from the muscle attachments (tendons) near the spur pulling on the heel.   RISK FACTORS  You may be at risk for a heel spur if you:  · Are older than 40.  · Are overweight.  · Have wear and tear arthritis (osteoarthritis).  · Have plantar fascia inflammation.  SIGNS AND SYMPTOMS   Some people have heel spurs but no symptoms. If you do have symptoms, they may include:   · Pain in the bottom of your heel.  · Pain that is worse when you first get out of bed.  · Pain that gets worse after walking or standing.  DIAGNOSIS   Your health care provider may diagnose a heel spur based on your symptoms and a physical exam. You may also have an X-ray of your foot to check for a bony growth coming from the calcaneus.   TREATMENT  Treatment aims to relieve the pain from the heel spur. This may include:  · Stretching exercises.  · Losing weight.  · Wearing specific shoes, inserts, or orthotics for comfort and support.  · Wearing splints at night to properly position your feet.  · Taking over-the-counter medicine to relieve pain.  · Being treated with high-intensity sound waves to break up the heel spur (extracorporeal shock wave therapy).  · Getting steroid injections in your heel to reduce swelling and ease pain.  · Having surgery if your heel spur causes long-term (chronic) pain.  HOME CARE INSTRUCTIONS   · Take medicines only as directed by your health care provider.  · Ask your health care provider if you should use ice or cold packs on the  painful areas of your heel or foot.  · Avoid activities that cause you pain until you recover or as directed by your health care provider.  · Stretch before exercising or being physically active.  · Wear supportive shoes that fit well as directed by your health care provider. You might need to buy new shoes. Wearing old shoes or shoes that do not fit correctly may not provide the support that you need.  · Lose weight if your health care provider thinks you should. This can relieve pressure on your foot that may be causing pain and discomfort.  SEEK MEDICAL CARE IF:   · Your pain continues or gets worse.     This information is not intended to replace advice given to you by your health care provider. Make sure you discuss any questions you have with your health care provider.     Document Released: 10/06/2005 Document Revised: 09/20/2014 Document Reviewed: 10/31/2013  Elsevier Interactive Patient Education ©2016 [employer]

## 2015-07-16 NOTE — ED Notes (Signed)
Patient ambulatory to triage with steady gait, without difficulty or distress noted; pt reports having left foot pain since yesterday; st felt pop in foot after stretching up

## 2015-07-16 NOTE — ED Notes (Signed)
MD McShane at bedside  

## 2015-07-16 NOTE — ED Provider Notes (Addendum)
[facility] Emergency Department Provider Note  ____________________________________________   I have reviewed the triage vital signs and the nursing notes.   HISTORY  Chief Complaint Foot Pain    HPI Taylor Mercer is a 29 y.o. female who states that she sometimes has pain in her heels. Usually on the right this time on the left. She states that yesterday she was at work and she raised up on her foot and then came down on her heel and it began to hurt. She has had no fever chills no other injury noted, she can bear weight but is tender. The pain is possibly in the calcaneal region. She has not had any pain over the lateral or medial malleolus to any great extent although there is some slight referred pain to the lateral malleolus. She has had no Achilles discomfort. No leg swelling, no numbness no weakness no chills. No recent travel no calf pain or swelling no history of DVT or PE. Has not taken anything for pain today.  Past Medical History  Diagnosis Date  . Hypertension   . Kidney stones   . Migraine     There are no active problems to display for this patient.   Past Surgical History  Procedure Laterality Date  . Cesarean section    . Abdominal hysterectomy    . Appendectomy      Current Outpatient Rx  Name  Route  Sig  Dispense  Refill  . amoxicillin-clavulanate (AUGMENTIN) 875-125 MG per tablet   Oral   Take 1 tablet by mouth every 12 (twelve) hours.   14 tablet   0   . chlorthalidone (HYGROTON) 25 MG tablet   Oral   Take 1 tablet (25 mg total) by mouth daily.   30 tablet   0   . doxycycline (VIBRAMYCIN) 100 MG capsule   Oral   Take 1 capsule (100 mg total) by mouth 2 (two) times daily.   20 capsule   0   . erythromycin ophthalmic ointment      Place a 1/2 inch ribbon of ointment into the lower right eyelid for 14 days twice a day.   1 g   0   . fluticasone (FLONASE) 50 MCG/ACT nasal spray   Each Nare   Place 1 spray  into both nostrils daily.         . predniSONE (DELTASONE) 10 MG tablet      4 tabs PO QD for 4 days; 3 tabs PO QD for 3 days; 2 tabs PO QD for 2 days; 1 tab PO QD for 1 day   30 tablet   0     Allergies Review of patient's allergies indicates no known allergies.  Family History  Problem Relation Age of Onset  . Hypertension Mother   . Cancer Father     Social History Social History  Substance Use Topics  . Smoking status: Never Smoker   . Smokeless tobacco: Never Used  . Alcohol Use: No    Review of Systems   ____________________________________________   PHYSICAL EXAM:  VITAL SIGNS: ED Triage Vitals  Enc Vitals Group     BP 07/16/15 0646 163/90 mmHg     Pulse Rate 07/16/15 0646 74     Resp 07/16/15 0646 20     Temp 07/16/15 0646 98 F (36.7 C)     Temp Source 07/16/15 0646 Oral     SpO2 07/16/15 0646 98 %     Weight 07/16/15  0646 215 lb (97.523 kg)     Height 07/16/15 0646 5\' 3"  (1.6 m)     Head Cir --      Peak Flow --      Pain Score 07/16/15 0645 6     Pain Loc --      Pain Edu? --      Excl. in GC? --     Constitutional: Alert and oriented. Well appearing and in no acute distress.  Back:  There is no focal tenderness or step off there is no midline tenderness there are no lesions noted. there is no CVA tenderness Musculoskeletal: No lower extremity tenderness. No joint effusions, no DVT signs strong distal pulses no edema Left foot: There is sinus palpation on the left calcaneal region. It is not at this time red or swollen or hot to touch. I do note the triage note . Pulses are strong and symmetric DP posterior tibial, there is no tenderness to palpation along the Achilles tendon and she has intact flexion and extension against resistance in the heel. She has no discomfort with medial or lateral rotation of the ankle. The patient has no tenderness to palpation along the malleoli bilaterally or the dorsum of the foot anywhere but the calcaneal  region. There is no crepitus, there is no erythema there is no abscess noted, there is no streaks or redness from the area, does not hot to touch. Compartments are soft with no evidence of DVT   Neurologic:  Normal speech and language. No gross focal neurologic deficits are appreciated.  Skin:  Skin is warm, dry and intact. No rash noted. Psychiatric: Mood and affect are normal. Speech and behavior are normal.  ____________________________________________   LABS (all labs ordered are listed, but only abnormal results are displayed)  Labs Reviewed - No data to display ____________________________________________  EKG   ____________________________________________  RADIOLOGY   ____________________________________________   PROCEDURES  Procedure(s) performed: None  Critical Care performed: None  ____________________________________________   INITIAL IMPRESSION / ASSESSMENT AND PLAN / ED COURSE  Pertinent labs & imaging results that were available during my care of the patient were reviewed by me and considered in my medical decision making (see chart for details).  Patient with pain over the heel with no evidence of infection no evidence on x-ray of fracture no evidence of compartment syndrome no evidence of tendon rupture including Achilles or PTT, no evidence of avulsion or other bony injury there is no evidence of vascular insufficiency or compromise there is no evidence of neurogenic cause for this discomfort there is no evidence of DVT. Most likely this is a plantar fasciitis or bone spur issue, we will have her stay off the foot, I have advised that she buy implants follow-up with podiatry and take nonsteroidals and I will give her a note for her job. ____________________________________________   FINAL CLINICAL IMPRESSION(S) / ED DIAGNOSES  Final diagnoses:  Heel spur, left    ----------------------------------------- 7:52 AM on  07/16/2015 -----------------------------------------  Patient's incidentally noted blood pressure is documented. Patient has no chest pain shortness breath or headache. She is not taking her blood pressure medication. I've advised her to take her medication. She does have a doctor she will follow-up. I can feel a short-term prescription for her blood pressure medication to tide her over. She understands return precautions for hypertension and she understands that she needs to take her medications and follow-up   Jeanmarie PlantJames A McShane, MD 07/16/15 [id number]  Jeanmarie PlantJames A McShane, MD  07/16/15 0753 

## 2015-10-20 DIAGNOSIS — J0101 Acute recurrent maxillary sinusitis: Secondary | ICD-10-CM | POA: Insufficient documentation

## 2015-10-20 DIAGNOSIS — L918 Other hypertrophic disorders of the skin: Secondary | ICD-10-CM | POA: Insufficient documentation

## 2015-10-20 DIAGNOSIS — R1011 Right upper quadrant pain: Secondary | ICD-10-CM | POA: Insufficient documentation

## 2015-12-18 DIAGNOSIS — F4322 Adjustment disorder with anxiety: Secondary | ICD-10-CM | POA: Insufficient documentation

## 2016-02-07 ENCOUNTER — Emergency Department
Admission: EM | Admit: 2016-02-07 | Discharge: 2016-02-07 | Disposition: A | Discharge status: Home / Self Care | Payer: BLUE CROSS/BLUE SHIELD | Attending: Emergency Medicine | Admitting: Emergency Medicine

## 2016-02-07 DIAGNOSIS — Z79899 Other long term (current) drug therapy: Secondary | ICD-10-CM | POA: Diagnosis not present

## 2016-02-07 DIAGNOSIS — Z7951 Long term (current) use of inhaled steroids: Secondary | ICD-10-CM | POA: Insufficient documentation

## 2016-02-07 DIAGNOSIS — G43909 Migraine, unspecified, not intractable, without status migrainosus: Secondary | ICD-10-CM | POA: Diagnosis not present

## 2016-02-07 DIAGNOSIS — I10 Essential (primary) hypertension: Secondary | ICD-10-CM | POA: Insufficient documentation

## 2016-02-07 DIAGNOSIS — R51 Headache: Secondary | ICD-10-CM | POA: Diagnosis present

## 2016-02-07 MED ORDER — METOCLOPRAMIDE HCL 5 MG/ML IJ SOLN
10.0000 mg | Freq: Once | INTRAMUSCULAR | Status: AC
  Administered 2016-02-07: 10 mg via INTRAVENOUS
  Filled 2016-02-07: qty 2

## 2016-02-07 MED ORDER — KETOROLAC TROMETHAMINE 30 MG/ML IJ SOLN
30.0000 mg | Freq: Once | INTRAMUSCULAR | Status: AC
  Administered 2016-02-07: 30 mg via INTRAVENOUS
  Filled 2016-02-07: qty 1

## 2016-02-07 MED ORDER — DIPHENHYDRAMINE HCL 50 MG/ML IJ SOLN
50.0000 mg | Freq: Once | INTRAMUSCULAR | Status: AC
  Administered 2016-02-07: 50 mg via INTRAVENOUS
  Filled 2016-02-07: qty 1

## 2016-02-07 MED ORDER — SODIUM CHLORIDE 0.9 % IV BOLUS (SEPSIS)
1000.0000 mL | Freq: Once | INTRAVENOUS | Status: AC
Start: 2016-02-07 — End: 2016-02-07
  Administered 2016-02-07: 1000 mL via INTRAVENOUS

## 2016-02-07 NOTE — ED Notes (Signed)
Pt c/o left sided frontal headache with blurred vision, sensitivity to light, and nausea. States that this has happened before.

## 2016-02-07 NOTE — ED Notes (Signed)
Pt presents to ED with c/o headache since around 0330. Hx of migraine headaches. +n/v. Sensitivity to light and sounds. Pt states she took ibuprofen at home without relief.

## 2016-02-07 NOTE — ED Provider Notes (Signed)
[facility] Emergency Department Provider Note  Time seen: 7:06 AM  I have reviewed the triage vital signs and the nursing notes.   HISTORY  Chief Complaint Headache    HPI Taylor Mercer is a 30 y.o. female with a past medical history of hypertension, migraine headaches, presents to the emergency department with a left-sided headache. According to the patient at 3:30 this morning she awoke with a left-sided headache. States she got in the bathtub hoping that would help the headache go away but it did not. Patient states it feels like her typical migraine with nausea and vomiting sensitivity to light and sound, although which is typical. Patient states she normally takes Imitrex to get rid of the headache however she was hoping to go to work today so she did not take her Imitrex as she states it can make her drowsy. Patient took ibuprofen instead, but states it did not help, and when she began vomiting she came to the emergency department for evaluation. Patient states it is been several months since she has had a headache this bad. Describes as moderate to severe dull aching especially on the left side of her head. Patient has required coming to the emergency department in the past for headaches.     Past Medical History  Diagnosis Date  . Hypertension   . Kidney stones   . Migraine     There are no active problems to display for this patient.   Past Surgical History  Procedure Laterality Date  . Cesarean section    . Abdominal hysterectomy    . Appendectomy      Current Outpatient Rx  Name  Route  Sig  Dispense  Refill  . amoxicillin-clavulanate (AUGMENTIN) 875-125 MG per tablet   Oral   Take 1 tablet by mouth every 12 (twelve) hours.   14 tablet   0   . azelastine (ASTELIN) 0.1 % nasal spray   Each Nare   Place 1 spray into both nostrils 2 (two) times daily.      12   . chlorthalidone (HYGROTON) 25 MG tablet   Oral   Take 1 tablet  (25 mg total) by mouth daily.   15 tablet   0   . doxycycline (VIBRAMYCIN) 100 MG capsule   Oral   Take 1 capsule (100 mg total) by mouth 2 (two) times daily.   20 capsule   0   . erythromycin ophthalmic ointment      Place a 1/2 inch ribbon of ointment into the lower right eyelid for 14 days twice a day.   1 g   0   . fluticasone (FLONASE) 50 MCG/ACT nasal spray   Each Nare   Place 1 spray into both nostrils daily.         . predniSONE (DELTASONE) 10 MG tablet      4 tabs PO QD for 4 days; 3 tabs PO QD for 3 days; 2 tabs PO QD for 2 days; 1 tab PO QD for 1 day   30 tablet   0   . sertraline (ZOLOFT) 50 MG tablet   Oral   Take 50 mg by mouth daily.      2   . SUMAtriptan (IMITREX) 50 MG tablet   Oral   Take 50 mg by mouth. Take 1 tablet (50mg ) by mouth every two hours as needed for migraine.           Allergies Review of patient's allergies  indicates no known allergies.  Family History  Problem Relation Age of Onset  . Hypertension Mother   . Cancer Father     Social History Social History  Substance Use Topics  . Smoking status: Never Smoker   . Smokeless tobacco: Never Used  . Alcohol Use: No    Review of Systems Constitutional: Negative for fever. Cardiovascular: Negative for chest pain. Respiratory: Negative for shortness of breath. Gastrointestinal: Negative for abdominal pain. Positive for nausea and vomiting. Negative for diarrhea. Musculoskeletal: Negative for back pain. Skin: Negative for rash. Neurological: Positive for headache. Denies any focal weakness or numbness. 10-point ROS otherwise negative.  ____________________________________________   PHYSICAL EXAM:  VITAL SIGNS: ED Triage Vitals  Enc Vitals Group     BP 02/07/16 0556 184/111 mmHg     Pulse Rate 02/07/16 0556 73     Resp 02/07/16 0556 20     Temp 02/07/16 0556 98.1 F (36.7 C)     Temp src --      SpO2 02/07/16 0556 98 %     Weight 02/07/16 0556 220 lb (99.791  kg)     Height 02/07/16 0556  (1.6 m)     Head Cir --      Peak Flow --      Pain Score 02/07/16 0556 8     Pain Loc --      Pain Edu? --      Excl. in GC? --     Constitutional: Alert and oriented. Well appearing and in no distress. Eyes: Normal exam, Positive photophobia. ENT   Head: Normocephalic and atraumatic.   Mouth/Throat: Mucous membranes are moist. Cardiovascular: Normal rate, regular rhythm. No murmur Respiratory: Normal respiratory effort without tachypnea nor retractions. Breath sounds are clear  Gastrointestinal: Soft and nontender. No distention.   Musculoskeletal: Nontender with normal range of motion in all extremities. Neurologic:  Normal speech and language. No gross focal neurologic deficits. Equal grip strengths bilaterally. No pronator drift. Cranial nerves intact. Skin:  Skin is warm, dry and intact.  Psychiatric: Mood and affect are normal.   ____________________________________________   INITIAL IMPRESSION / ASSESSMENT AND PLAN / ED COURSE  Pertinent labs & imaging results that were available during my care of the patient were reviewed by me and considered in my medical decision making (see chart for details).  The patient presents the emergency department with a headache, likely migraine headache. Overall the patient appears well, no distress, good neurologic exam. She states her symptoms are typical of her normal migraine headache, which typically resolve with Imitrex, but she did not take Imitrex today. We will treat with Reglan, Toradol, Benadryl, IV fluids, and monitor closely in the emergency department for improvement.  Patient states her headache is much improved. She is requesting to be discharged home at this time.  ____________________________________________   FINAL CLINICAL IMPRESSION(S) / ED DIAGNOSES  Migraine headache   Minna Antis, MD 02/07/16 [phone]

## 2016-02-07 NOTE — Discharge Instructions (Signed)

## 2016-02-23 DIAGNOSIS — R591 Generalized enlarged lymph nodes: Secondary | ICD-10-CM | POA: Insufficient documentation

## 2016-06-18 DIAGNOSIS — R1030 Lower abdominal pain, unspecified: Secondary | ICD-10-CM | POA: Insufficient documentation

## 2016-07-12 ENCOUNTER — Ambulatory Visit (INDEPENDENT_AMBULATORY_CARE_PROVIDER_SITE_OTHER): Payer: BLUE CROSS/BLUE SHIELD

## 2016-07-12 ENCOUNTER — Encounter: Payer: Self-pay | Admitting: Emergency Medicine

## 2016-07-12 ENCOUNTER — Ambulatory Visit
Admission: EM | Admit: 2016-07-12 | Discharge: 2016-07-12 | Disposition: A | Discharge status: Home / Self Care | Payer: BLUE CROSS/BLUE SHIELD | Attending: Family Medicine | Admitting: Family Medicine

## 2016-07-12 DIAGNOSIS — S93401A Sprain of unspecified ligament of right ankle, initial encounter: Secondary | ICD-10-CM

## 2016-07-12 MED ORDER — MELOXICAM 15 MG PO TABS: 15.0000 mg | ORAL_TABLET | Freq: Every day | ORAL | 0 refills | Status: DC | PRN

## 2016-07-12 MED ORDER — OXYCODONE-ACETAMINOPHEN 5-325 MG PO TABS
1.0000 | ORAL_TABLET | Freq: Three times a day (TID) | ORAL | 0 refills | Status: DC | PRN
Start: 2016-07-12 — End: 2019-03-01

## 2016-07-12 NOTE — Discharge Instructions (Signed)
Take medication as prescribed. Rest. Apply ice. Wear splint as long as pain continues.   Follow up with podiatry next week as scheduled.   Follow up with your primary care physician this week as needed. Return to Urgent care for new or worsening concerns.

## 2016-07-12 NOTE — ED Triage Notes (Signed)
Patient c/o pain in her right ankle after twisting her ankle on Friday.

## 2016-07-12 NOTE — ED Provider Notes (Signed)
[facility] ____________________________________________  Time seen: Approximately 6:17 PM  I have reviewed the triage vital signs and the nursing notes.   HISTORY  Chief Complaint Ankle Pain   HPI Taylor Mercer is a 30 y.o. female presents with complaint of right ankle pain. Patient reports approximately 1 month ago she accidentally ran into the side of her cabinet causing some pain but had nearly resolved. Patient reports this past Friday she accidentally misstepped on a curb causing her to roll her right ankle. Patient reports pain to right lateral ankle since. Reports has remained ambulatory and has continued to work, but pain present to her right lateral ankle. Patient reports pain is primarily with walking. States mild pain at rest. Denies pain radiation. Denies any numbness or tingling sensation. Denies history of similar to right ankle or right foot in the past. Denies any other injury. Denies fall, head injury or loss of consciousness. Patient reports mild pain at this time. Reports unresolving over-the-counter ibuprofen.  Denies chest pain, shortness of breath, fatigue, headache, vision changes, neck pain, back pain, or recent sickness.  No LMP recorded. Patient has had a hysterectomy.   Past Medical History:  Diagnosis Date  . Hypertension   . Kidney stones   . Migraine     There are no active problems to display for this patient.   Past Surgical History:  Procedure Laterality Date  . ABDOMINAL HYSTERECTOMY    . APPENDECTOMY    . CESAREAN SECTION      Current Outpatient Rx  . Order #: [id number] Class: Historical Med  . Order #: [id number] Class: Historical Med  . Order #: [id number] Class: Print  . Order #: [id number] Class: Historical Med  . Order #: [id number] Class: Print  . Order #: [id number] Class: Print  . Order #: [id number] Class: Historical Med    No current facility-administered medications for this encounter.   Current Outpatient  Prescriptions:  .  amLODipine (NORVASC) 5 MG tablet, Take 5 mg by mouth daily., Disp: , Rfl:  .  azelastine (ASTELIN) 0.1 % nasal spray, Place 1 spray into both nostrils 2 (two) times daily., Disp: , Rfl: 12 .  chlorthalidone (HYGROTON) 25 MG tablet, Take 1 tablet (25 mg total) by mouth daily., Disp: 15 tablet, Rfl: 0 .  fluticasone (FLONASE) 50 MCG/ACT nasal spray, Place 1 spray into both nostrils daily., Disp: , Rfl:  .  meloxicam (MOBIC) 15 MG tablet, Take 1 tablet (15 mg total) by mouth daily as needed for pain., Disp: 10 tablet, Rfl: 0 .  oxyCODONE-acetaminophen (ROXICET) 5-325 MG tablet, Take 1 tablet by mouth every 8 (eight) hours as needed for moderate pain or severe pain (Do not drive or operate heavy machinery while taking as can cause drowsiness.)., Disp: 5 tablet, Rfl: 0 .  SUMAtriptan (IMITREX) 50 MG tablet, Take 50 mg by mouth. Take 1 tablet (50mg ) by mouth every two hours as needed for migraine., Disp: , Rfl:   Allergies Review of patient's allergies indicates no known allergies.  Family History  Problem Relation Age of Onset  . Hypertension Mother   . Cancer Father     Social History Social History  Substance Use Topics  . Smoking status: Never Smoker  . Smokeless tobacco: Never Used  . Alcohol use No    Review of Systems Constitutional: No fever/chills Eyes: No visual changes. ENT: No sore throat. Cardiovascular: Denies chest pain. Respiratory: Denies shortness of breath. Gastrointestinal: No abdominal pain.  No nausea, no vomiting.  No diarrhea.  No constipation. Genitourinary:  Negative for dysuria. Musculoskeletal: Negative for back pain.As above.  Skin: Negative for rash. Neurological: Negative for headaches, focal weakness or numbness.  10-point ROS otherwise negative.  ____________________________________________   PHYSICAL EXAM:  VITAL SIGNS: ED Triage Vitals  Enc Vitals Group     BP 07/12/16 1756 (S) (!) 152/103     Pulse Rate 07/12/16 1756 99       Resp 07/12/16 1756 16     Temp 07/12/16 1756 97 F (36.1 C)     Temp Source 07/12/16 1756 Tympanic     SpO2 07/12/16 1756 100 %     Weight 07/12/16 1754 225 lb (102.1 kg)     Height 07/12/16 1754 5\' 3"  (1.6 m)     Head Circumference --      Peak Flow --      Pain Score 07/12/16 1756 7     Pain Loc --      Pain Edu? --      Excl. in GC? --     Constitutional: Alert and oriented. Well appearing and in no acute distress. Eyes: Conjunctivae are normal. PERRL. EOMI. ENT      Head: Normocephalic and atraumatic.      Mouth/Throat: Mucous membranes are moist. Cardiovascular: Normal rate, regular rhythm. Grossly normal heart sounds.  Good peripheral circulation. Respiratory: Normal respiratory effort without tachypnea nor retractions. Breath sounds are clear and equal bilaterally. No wheezes/rales/rhonchi. Musculoskeletal:  Nontender with normal range of motion in all extremities. No midline cervical, thoracic or lumbar tenderness to palpation. Bilateral pedal pulses equal and easily palpated.      Right lower leg:  No tenderness or edema. Except : Right lateral malleolus mild to moderate tenderness to direct palpation, mild swelling, no ecchymosis, no erythema, pain with right ankle rotation, full range of motion present, no pain with plantar flexion or dorsiflexion, no Achilles tendon tenderness, right lower extremity otherwise nontender. Bilateral pedal pulses equal and easily palpated. Ambulatory with mild antalgic gait.       Left lower leg:  No tenderness or edema.  Neurologic:  Normal speech and language. No gross focal neurologic deficits are appreciated. Speech is normal. No gait instability.  Skin:  Skin is warm, dry and intact. No rash noted. Psychiatric: Mood and affect are normal. Speech and behavior are normal. Patient exhibits appropriate insight and judgment   ___________________________________________   LABS (all labs ordered are listed, but only abnormal results are  displayed)  Labs Reviewed - No data to display ____________________________________________  RADIOLOGY  Dg Ankle Complete Right  Result Date: 07/12/2016 CLINICAL DATA:  Initial encounter for Pt injured right ankle 4 days ago stepping off curb and twisting ankle. Most pain lat malleolus and slightly anterior. Incidentally pt hit lat malleolus on cabinet edge 1 month ago and has been tender since. EXAM: RIGHT ANKLE - COMPLETE 3+ VIEW COMPARISON:  None. FINDINGS: No acute fracture or dislocation. Small Achilles and calcaneal spurs. Os trigonum. Base of fifth metatarsal and talar dome intact. IMPRESSION: No acute osseous abnormality. Electronically Signed   By: Jeronimo Greaves M.D.   On: 07/12/2016 18:13   ____________________________________________   PROCEDURES Procedures   Stirrup Velcro splint applied by RN. Neurovascular intact post application. ____________________________________________   INITIAL IMPRESSION / ASSESSMENT AND PLAN / ED COURSE  Pertinent labs & imaging results that were available during my care of the patient were reviewed by me and considered in my medical decision making (see chart for details).   Very well-appearing patient. No acute  distress. Presents for the complaints of right lateral ankle pain post mechanical injury. Denies other pain. Right ankle x-ray per radiologist negative for acute osseous abnormalities. Suspect sprain injury. Encouraged supportive care, rest, ice, splint, gradual increase in activity as tolerated. Patient declined crutches. Patient reports over-the-counter ibuprofen not helping with pain, will see patient on daily Mobic. Patient reports tramadol causes headaches for her, we'll give prescription quantity #5 Percocet for breakthrough pain as needed. Also discussed in detail with patient continue taking home blood pressure medications as prescribed. Patient reports she does take blood pressure medications daily as prescribed, but reports she has  been in pain. Recommend taking blood pressure medications as well as keeping journal at home and patient reports she will check her blood pressure at home and keep a journal follow-up with primary care physician this week.Discussed indication, risks and benefits of medications with patient.Discussed strict follow up and return parameters.   [place] controlled substance database reviewed, no controlled Rx documented in last 6 months.   Discussed follow up with Primary care physician this week. Discussed follow up and return parameters including no resolution or any worsening concerns. Patient verbalized understanding and agreed to plan.   ____________________________________________   FINAL CLINICAL IMPRESSION(S) / ED DIAGNOSES  Final diagnoses:  Sprain of right ankle, unspecified ligament, initial encounter     Discharge Medication List as of 07/12/2016  6:41 PM    START taking these medications   Details  meloxicam (MOBIC) 15 MG tablet Take 1 tablet (15 mg total) by mouth daily as needed for pain., Starting Mon 07/12/2016, Print    oxyCODONE-acetaminophen (ROXICET) 5-325 MG tablet Take 1 tablet by mouth every 8 (eight) hours as needed for moderate pain or severe pain (Do not drive or operate heavy machinery while taking as can cause drowsiness.)., Starting Mon 07/12/2016, Print        Note: This dictation was prepared with Dragon dictation along with smaller phrase technology. Any transcriptional errors that result from this process are unintentional.    Clinical Course      Renford DillsLindsey Fredrik Mogel, NP 07/12/16 1923    Renford DillsLindsey Dallen Bunte, NP 07/12/16 1924

## 2017-01-03 DIAGNOSIS — L409 Psoriasis, unspecified: Secondary | ICD-10-CM | POA: Insufficient documentation

## 2017-01-19 ENCOUNTER — Ambulatory Visit
Admission: EM | Admit: 2017-01-19 | Discharge: 2017-01-19 | Disposition: A | Discharge status: Home / Self Care | Payer: BLUE CROSS/BLUE SHIELD | Attending: Family Medicine | Admitting: Family Medicine

## 2017-01-19 ENCOUNTER — Encounter: Payer: Self-pay | Admitting: *Deleted

## 2017-01-19 DIAGNOSIS — S29019A Strain of muscle and tendon of unspecified wall of thorax, initial encounter: Secondary | ICD-10-CM

## 2017-01-19 MED ORDER — CYCLOBENZAPRINE HCL 10 MG PO TABS: 10.0000 mg | ORAL_TABLET | Freq: Three times a day (TID) | ORAL | 0 refills | Status: DC | PRN

## 2017-01-19 NOTE — ED Provider Notes (Signed)
[facility]    CSN: [id number] Arrival date & time: 01/19/17  1556     History   Chief Complaint Chief Complaint  Patient presents with  . Back Pain    HPI Taylor Mercer is a 31 y.o. female.   The history is provided by the patient.  Back Pain  Location:  Thoracic spine and lumbar spine Quality:  Aching Radiates to:  Does not radiate Pain severity:  Moderate Pain is:  Same all the time Duration:  2 days Timing:  Constant Progression:  Worsening Chronicity:  New Context: not emotional stress, not falling, not jumping from heights, not lifting heavy objects, not MCA, not MVA, not occupational injury, not pedestrian accident, not physical stress, not recent illness, not recent injury and not twisting   Relieved by:  Nothing Ineffective treatments:  OTC medications Associated symptoms: no abdominal pain, no abdominal swelling, no bladder incontinence, no bowel incontinence, no chest pain, no dysuria, no fever, no headaches, no leg pain, no numbness, no paresthesias, no pelvic pain, no perianal numbness, no tingling, no weakness and no weight loss   Risk factors: no hx of cancer, no hx of osteoporosis, no lack of exercise, no menopause, not obese, not pregnant, no recent surgery, no steroid use and no vascular disease     Past Medical History:  Diagnosis Date  . Hypertension   . Kidney stones   . Migraine     There are no active problems to display for this patient.   Past Surgical History:  Procedure Laterality Date  . ABDOMINAL HYSTERECTOMY    . APPENDECTOMY    . CESAREAN SECTION      OB History    No data available       Home Medications    Prior to Admission medications   Medication Sig Start Date End Date Taking? Authorizing Provider  amLODipine (NORVASC) 10 MG tablet Take 10 mg by mouth daily.   Yes [provider]  chlorthalidone (HYGROTON) 25 MG tablet Take 1 tablet (25 mg total) by mouth daily. 07/16/15  Yes Jeanmarie Plant, MD  fluticasone (FLONASE) 50 MCG/ACT nasal spray Place 1 spray into both nostrils daily.   Yes [provider]  amLODipine (NORVASC) 5 MG tablet Take 5 mg by mouth daily.    [provider]  azelastine (ASTELIN) 0.1 % nasal spray Place 1 spray into both nostrils 2 (two) times daily. 12/11/15   [provider]  cyclobenzaprine (FLEXERIL) 10 MG tablet Take 1 tablet (10 mg total) by mouth 3 (three) times daily as needed for muscle spasms. 01/19/17   Payton Mccallum, MD  meloxicam (MOBIC) 15 MG tablet Take 1 tablet (15 mg total) by mouth daily as needed for pain. 07/12/16   Renford Dills, NP  oxyCODONE-acetaminophen (ROXICET) 5-325 MG tablet Take 1 tablet by mouth every 8 (eight) hours as needed for moderate pain or severe pain (Do not drive or operate heavy machinery while taking as can cause drowsiness.). 07/12/16   Renford Dills, NP  SUMAtriptan (IMITREX) 50 MG tablet Take 50 mg by mouth. Take 1 tablet (50mg ) by mouth every two hours as needed for migraine. 02/03/16   [provider]    Family History Family History  Problem Relation Age of Onset  . Hypertension Mother   . Cancer Father     Social History Social History  Substance Use Topics  . Smoking status: Never Smoker  . Smokeless tobacco: Never Used  . Alcohol use No  Allergies   Patient has no known allergies.   Review of Systems Review of Systems  Constitutional: Negative for fever and weight loss.  Cardiovascular: Negative for chest pain.  Gastrointestinal: Negative for abdominal pain and bowel incontinence.  Genitourinary: Negative for bladder incontinence, dysuria and pelvic pain.  Musculoskeletal: Positive for back pain.  Neurological: Negative for tingling, weakness, numbness, headaches and paresthesias.     Physical Exam Triage Vital Signs ED Triage Vitals  Enc Vitals Group     BP 01/19/17 1644 (!) 151/99     Pulse Rate 01/19/17 1644 94     Resp 01/19/17 1644 16      Temp 01/19/17 1644 98.1 F (36.7 C)     Temp Source 01/19/17 1644 Oral     SpO2 01/19/17 1644 100 %     Weight 01/19/17 1645 225 lb (102.1 kg)     Height 01/19/17 1645 5\' 4"  (1.626 m)     Head Circumference --      Peak Flow --      Pain Score 01/19/17 1645 7     Pain Loc --      Pain Edu? --      Excl. in GC? --    No data found.   Updated Vital Signs BP (!) 151/99 (BP Location: Left Arm)   Pulse 94   Temp 98.1 F (36.7 C) (Oral)   Resp 16   Ht 5\' 4"  (1.626 m)   Wt 225 lb (102.1 kg)   SpO2 100%   BMI 38.62 kg/m   Visual Acuity Right Eye Distance:   Left Eye Distance:   Bilateral Distance:    Right Eye Near:   Left Eye Near:    Bilateral Near:     Physical Exam  Constitutional: She appears well-developed and well-nourished. No distress.  Musculoskeletal: She exhibits tenderness. She exhibits no edema.       Thoracic back: She exhibits tenderness (over the right lower thoracic paraspinous muscles) and spasm.       Lumbar back: She exhibits tenderness (upper lumbar (lower thoracic) paraspinous muscles) and spasm. She exhibits normal range of motion, no bony tenderness, no swelling, no edema, no deformity, no laceration, no pain and normal pulse.  Neurological: She is alert. She has normal reflexes. She displays normal reflexes. She exhibits normal muscle tone.  Skin: Skin is warm and dry. No rash noted. She is not diaphoretic. No erythema.  Nursing note and vitals reviewed.    UC Treatments / Results  Labs (all labs ordered are listed, but only abnormal results are displayed) Labs Reviewed - No data to display  EKG  EKG Interpretation None       Radiology No results found.  Procedures Procedures (including critical care time)  Medications Ordered in UC Medications - No data to display   Initial Impression / Assessment and Plan / UC Course  I have reviewed the triage vital signs and the nursing notes.  Pertinent labs & imaging results that were  available during my care of the patient were reviewed by me and considered in my medical decision making (see chart for details).       Final Clinical Impressions(s) / UC Diagnoses   Final diagnoses:  Thoracic myofascial strain, initial encounter    New Prescriptions Discharge Medication List as of 01/19/2017  5:06 PM    START taking these medications   Details  cyclobenzaprine (FLEXERIL) 10 MG tablet Take 1 tablet (10 mg total) by mouth 3 (three) times  daily as needed for muscle spasms., Starting Wed 01/19/2017, Normal       1. diagnosis reviewed with patient 2. rx as per orders above; reviewed possible side effects, interactions, risks and benefits  3. Recommend supportive treatment with heat, stretching  4. Follow-up prn if symptoms worsen or don't improve   Payton Mccallum, MD 01/19/17 1747

## 2017-01-19 NOTE — ED Triage Notes (Signed)
Patient started having thoracic spine pain 1 month ago. Mechanism of injury unknown. The thoracic spine pain became worse 2 days ago.

## 2017-02-25 DIAGNOSIS — H6981 Other specified disorders of Eustachian tube, right ear: Secondary | ICD-10-CM | POA: Insufficient documentation

## 2017-04-15 ENCOUNTER — Emergency Department
Admission: EM | Admit: 2017-04-15 | Discharge: 2017-04-15 | Disposition: A | Discharge status: Home / Self Care | Payer: BLUE CROSS/BLUE SHIELD | Attending: Emergency Medicine | Admitting: Emergency Medicine

## 2017-04-15 ENCOUNTER — Encounter: Payer: Self-pay | Admitting: Intensive Care

## 2017-04-15 DIAGNOSIS — R1013 Epigastric pain: Secondary | ICD-10-CM | POA: Insufficient documentation

## 2017-04-15 DIAGNOSIS — I10 Essential (primary) hypertension: Secondary | ICD-10-CM | POA: Diagnosis not present

## 2017-04-15 DIAGNOSIS — G43809 Other migraine, not intractable, without status migrainosus: Secondary | ICD-10-CM | POA: Diagnosis not present

## 2017-04-15 DIAGNOSIS — Z79899 Other long term (current) drug therapy: Secondary | ICD-10-CM | POA: Insufficient documentation

## 2017-04-15 DIAGNOSIS — R109 Unspecified abdominal pain: Secondary | ICD-10-CM | POA: Diagnosis present

## 2017-04-15 HISTORY — DX: Endometriosis, unspecified: N80.9

## 2017-04-15 LAB — COMPREHENSIVE METABOLIC PANEL
ALBUMIN: 4.5 g/dL (ref 3.5–5.0)
ALK PHOS: 61 U/L (ref 38–126)
ALT: 42 U/L (ref 14–54)
AST: 34 U/L (ref 15–41)
Anion gap: 12 (ref 5–15)
BILIRUBIN TOTAL: 0.9 mg/dL (ref 0.3–1.2)
BUN: 10 mg/dL (ref 6–20)
CALCIUM: 9.6 mg/dL (ref 8.9–10.3)
CO2: 24 mmol/L (ref 22–32)
Chloride: 101 mmol/L (ref 101–111)
Creatinine, Ser: 0.79 mg/dL (ref 0.44–1.00)
GFR calc Af Amer: >60 mL/min (ref >60)
GFR calc non Af Amer: >60 mL/min (ref >60)
GLUCOSE: 131 mg/dL — AB (ref 65–99)
Potassium: 4 mmol/L (ref 3.5–5.1)
Sodium: 137 mmol/L (ref 135–145)
TOTAL PROTEIN: 8.3 g/dL — AB (ref 6.5–8.1)

## 2017-04-15 LAB — LIPASE, BLOOD: Lipase: 22 U/L (ref 11–51)

## 2017-04-15 LAB — URINALYSIS, COMPLETE (UACMP) WITH MICROSCOPIC
BILIRUBIN URINE: NEGATIVE
Glucose, UA: NEGATIVE mg/dL
HGB URINE DIPSTICK: NEGATIVE
Ketones, ur: NEGATIVE mg/dL
Leukocytes, UA: NEGATIVE
NITRITE: NEGATIVE
PROTEIN: NEGATIVE mg/dL
SPECIFIC GRAVITY, URINE: 1.004 — AB (ref 1.005–1.030)
pH: 8 (ref 5.0–8.0)

## 2017-04-15 LAB — CBC
HCT: 42.5 % (ref 35.0–47.0)
HEMOGLOBIN: 15.1 g/dL (ref 12.0–16.0)
MCH: 29.6 pg (ref 26.0–34.0)
MCHC: 35.4 g/dL (ref 32.0–36.0)
MCV: 83.7 fL (ref 80.0–100.0)
Platelets: 240 10*3/uL (ref 150–440)
RBC: 5.08 MIL/uL (ref 3.80–5.20)
RDW: 13 % (ref 11.5–14.5)
WBC: 10.1 10*3/uL (ref 3.6–11.0)

## 2017-04-15 MED ORDER — TRAMADOL HCL 50 MG PO TABS: 50.0000 mg | ORAL_TABLET | Freq: Four times a day (QID) | ORAL | 0 refills | Status: DC | PRN

## 2017-04-15 MED ORDER — PANTOPRAZOLE SODIUM 40 MG PO TBEC: 40.0000 mg | DELAYED_RELEASE_TABLET | Freq: Every day | ORAL | 1 refills | Status: DC

## 2017-04-15 MED ORDER — KETOROLAC TROMETHAMINE 30 MG/ML IJ SOLN
30.0000 mg | Freq: Once | INTRAMUSCULAR | Status: AC
  Administered 2017-04-15: 30 mg via INTRAVENOUS
  Filled 2017-04-15: qty 1

## 2017-04-15 MED ORDER — DIPHENHYDRAMINE HCL 50 MG/ML IJ SOLN
50.0000 mg | Freq: Once | INTRAMUSCULAR | Status: AC
  Administered 2017-04-15: 50 mg via INTRAVENOUS
  Filled 2017-04-15: qty 1

## 2017-04-15 MED ORDER — METOCLOPRAMIDE HCL 5 MG/ML IJ SOLN
10.0000 mg | Freq: Once | INTRAMUSCULAR | Status: AC
  Administered 2017-04-15: 10 mg via INTRAVENOUS
  Filled 2017-04-15: qty 2

## 2017-04-15 MED ORDER — SODIUM CHLORIDE 0.9 % IV BOLUS (SEPSIS)
1000.0000 mL | Freq: Once | INTRAVENOUS | Status: AC
  Administered 2017-04-15: 1000 mL via INTRAVENOUS

## 2017-04-15 MED ORDER — GI COCKTAIL ~~LOC~~
30.0000 mL | Freq: Once | ORAL | Status: AC
  Administered 2017-04-15: 30 mL via ORAL
  Filled 2017-04-15: qty 30

## 2017-04-15 MED ORDER — ONDANSETRON 4 MG PO TBDP
4.0000 mg | ORAL_TABLET | Freq: Once | ORAL | Status: AC | PRN
  Administered 2017-04-15: 4 mg via ORAL
  Filled 2017-04-15: qty 1

## 2017-04-15 NOTE — ED Triage Notes (Addendum)
Patient presents to ER with c/o migraine since this AM and abdominal pain X3 days. Reports nausea but no emesis. Ambulatory in triage with no problems. Denies fevers. Patient also c/o burning and frequent urination

## 2017-04-15 NOTE — ED Provider Notes (Signed)
[facility] Emergency Department Provider Note  Time seen: 3:15 PM  I have reviewed the triage vital signs and the nursing notes.   HISTORY  Chief Complaint Abdominal Pain    HPI Taylor Mercer is a 31 y.o. female the past medical history of endometriosis, hypertension, migraines, presents the emergency department for upper abdominal discomfort and a migraine headache. According to the patient for the past 2-3 days she has been feeling generalized fatigue nausea with epigastric pain/burning. States she feels like she needs to vomit but has not vomited. Denies diarrhea. States occasional slight dysuria. Patient states a history of migraines. Patient has required ER visits for migraines in the past. States it feels like a migraine. Denies any focal weakness or numbness. Denies confusion, slurred speech or fever.  Past Medical History:  Diagnosis Date  . Endometriosis   . Hypertension   . Kidney stones   . Kidney stones   . Migraine     There are no active problems to display for this patient.   Past Surgical History:  Procedure Laterality Date  . ABDOMINAL HYSTERECTOMY    . APPENDECTOMY    . CESAREAN SECTION      Prior to Admission medications   Medication Sig Start Date End Date Taking? Authorizing Provider  amLODipine (NORVASC) 10 MG tablet Take 10 mg by mouth daily.    [provider]  amLODipine (NORVASC) 5 MG tablet Take 5 mg by mouth daily.    [provider]  azelastine (ASTELIN) 0.1 % nasal spray Place 1 spray into both nostrils 2 (two) times daily. 12/11/15   [provider]  chlorthalidone (HYGROTON) 25 MG tablet Take 1 tablet (25 mg total) by mouth daily. 07/16/15   Jeanmarie PlantMcShane, James A, MD  cyclobenzaprine (FLEXERIL) 10 MG tablet Take 1 tablet (10 mg total) by mouth 3 (three) times daily as needed for muscle spasms. 01/19/17   [name], Orlando, MD  fluticasone (FLONASE) 50 MCG/ACT nasal spray Place 1 spray into both  nostrils daily.    [provider]  meloxicam (MOBIC) 15 MG tablet Take 1 tablet (15 mg total) by mouth daily as needed for pain. 07/12/16   Renford DillsMiller, Lindsey, NP  oxyCODONE-acetaminophen (ROXICET) 5-325 MG tablet Take 1 tablet by mouth every 8 (eight) hours as needed for moderate pain or severe pain (Do not drive or operate heavy machinery while taking as can cause drowsiness.). 07/12/16   Renford DillsMiller, Lindsey, NP  SUMAtriptan (IMITREX) 50 MG tablet Take 50 mg by mouth. Take 1 tablet (50mg ) by mouth every two hours as needed for migraine. 02/03/16   [provider]    No Known Allergies  Family History  Problem Relation Age of Onset  . Hypertension Mother   . Cancer Father     Social History Social History  Substance Use Topics  . Smoking status: Never Smoker  . Smokeless tobacco: Never Used  . Alcohol use No    Review of Systems Constitutional: Negative for fever. Cardiovascular: Negative for chest pain. Respiratory: Negative for shortness of breath. Gastrointestinal:Moderate epigastric burning. Positive for nausea. Negative for vomiting or diarrhea. Genitourinary: Intermittent mild dysuria Musculoskeletal: Negative for back pain. Neurological: Moderate headache. No focal weakness or numbness. All other ROS negative  ____________________________________________   PHYSICAL EXAM:  VITAL SIGNS: ED Triage Vitals  Enc Vitals Group     BP 04/15/17 1124 (!) 171/110     Pulse Rate 04/15/17 1124 (!) 103     Resp 04/15/17 1124 16  Temp 04/15/17 1124 98.9 F (37.2 C)     Temp Source 04/15/17 1124 Oral     SpO2 04/15/17 1124 100 %     Weight 04/15/17 1124 220 lb (99.8 kg)     Height 04/15/17 1124 5\' 3"  (1.6 m)     Head Circumference --      Peak Flow --      Pain Score 04/15/17 1123 8     Pain Loc --      Pain Edu? --      Excl. in GC? --     Constitutional: Alert and oriented. Well appearing and in no distress. Eyes: Normal exam ENT   Head:  Normocephalic and atraumatic.   Mouth/Throat: Mucous membranes are moist. Cardiovascular: Normal rate, regular rhythm. No murmur Respiratory: Normal respiratory effort without tachypnea nor retractions. Breath sounds are clear  Gastrointestinal: Soft, mild epigastric tenderness palpation, abdomen otherwise benign. No rebound or guarding. No distention. Musculoskeletal: Nontender with normal range of motion in all extremities.  Neurologic:  Normal speech and language. No gross focal neurologic deficits. Equal grip strength bilaterally. No pronator drift. Cranial nerves intact. Skin:  Skin is warm, dry and intact.  Psychiatric: Mood and affect are normal. Speech and behavior are normal.   ____________________________________________   INITIAL IMPRESSION / ASSESSMENT AND PLAN / ED COURSE  Pertinent labs & imaging results that were available during my care of the patient were reviewed by me and considered in my medical decision making (see chart for details).  The patient presented to the emergency department for 3 days of epigastric discomfort. Patient states a history of epigastric pain in the past, has had an endoscopy previously or similar discomfort, patient states the endoscopy found some inflammation or bleeding in her stomach at that time. Patient denies any vomiting. Denies any diarrhea, black or bloody stool. Denies any fever. She also states over the past day or so she has developed a migraine headache. Has a significant history of migraine headaches. States this feels like a migraine. Patient has a normal/intact neurological exam. She doesn't mild epigastric tenderness on exam otherwise benign abdomen. Patient's labs are very reassuring including normal LFTs and lipase. White blood cell count is normal. Urinalysis is normal. We will treat with Toradol, Reglan, Benadryl, IV fluids, as well as a GI cocktail and monitor for symptom relief.  Patient states she is feeling much better after  medications. States the headache is nearly gone. Highly suspect gastritis as the cause of the patient's epigastric discomfort. We will switch to Protonix 40 mg daily, I recommended liquid Maalox and we will discharge with a peri-short course of pain medication. Patient is agreeable to this plan.  ____________________________________________   FINAL CLINICAL IMPRESSION(S) / ED DIAGNOSES  Epigastric pain Migraine headache    Minna AntisPaduchowski, Kierah Goatley, MD 04/15/17 [phone]

## 2017-06-11 ENCOUNTER — Ambulatory Visit
Admission: EM | Admit: 2017-06-11 | Discharge: 2017-06-11 | Disposition: A | Discharge status: Home / Self Care | Payer: BLUE CROSS/BLUE SHIELD | Attending: Family Medicine | Admitting: Family Medicine

## 2017-06-11 ENCOUNTER — Ambulatory Visit (INDEPENDENT_AMBULATORY_CARE_PROVIDER_SITE_OTHER): Payer: BLUE CROSS/BLUE SHIELD

## 2017-06-11 DIAGNOSIS — S93402A Sprain of unspecified ligament of left ankle, initial encounter: Secondary | ICD-10-CM

## 2017-06-11 MED ORDER — MELOXICAM 15 MG PO TABS: 15.0000 mg | ORAL_TABLET | Freq: Every day | ORAL | 0 refills | Status: DC

## 2017-06-11 NOTE — ED Provider Notes (Signed)
[facility]    CSN: [id number] Arrival date & time: 06/11/17  1307     History   Chief Complaint Chief Complaint  Patient presents with  . Foot Pain    HPI Taylor Mercer is a 31 y.o. female.   Patient reports last night started developing pain in her ankle. Or torso leg which took a while for to heal she finally had to wear boot for the healing of the ligamentous tear left ankle. This was a different area than it was before. She denies any trauma to the left ankle she denies any new injury. She was at the kitchen sink standing was started on the pain and swelling of the left ankle. Since that she's had discomfort swelling and pain. She came in today to make sure the ligaments of the left ankle were okay. She states that she was given a boot before but she could not find her boot. She saw one the local podiatrist before. She does not smoke. History of hypertension and kidney stones endometriosis and migraines. She's had abdominal hysterectomy appendectomy and C-section in the past. No pertinent family medical history relevant to today's visit. No known drug allergies   The history is provided by the patient and a friend. No language interpreter was used.  Foot Pain  This is a new problem. The current episode started yesterday. The problem occurs constantly. The problem has not changed since onset.Pertinent negatives include no chest pain, no abdominal pain, no headaches and no shortness of breath. The symptoms are aggravated by walking. Nothing relieves the symptoms. She has tried nothing for the symptoms. The treatment provided mild relief.    Past Medical History:  Diagnosis Date  . Endometriosis   . Hypertension   . Kidney stones   . Kidney stones   . Migraine     There are no active problems to display for this patient.   Past Surgical History:  Procedure Laterality Date  . ABDOMINAL HYSTERECTOMY    . APPENDECTOMY    . CESAREAN SECTION      OB  History    No data available       Home Medications    Prior to Admission medications   Medication Sig Start Date End Date Taking? Authorizing Provider  amLODipine (NORVASC) 10 MG tablet Take 10 mg by mouth daily.    [provider]  amLODipine (NORVASC) 5 MG tablet Take 5 mg by mouth daily.    [provider]  azelastine (ASTELIN) 0.1 % nasal spray Place 1 spray into both nostrils 2 (two) times daily. 12/11/15   [provider]  chlorthalidone (HYGROTON) 25 MG tablet Take 1 tablet (25 mg total) by mouth daily. 07/16/15   Jeanmarie Plant, MD  cyclobenzaprine (FLEXERIL) 10 MG tablet Take 1 tablet (10 mg total) by mouth 3 (three) times daily as needed for muscle spasms. 01/19/17   Payton Mccallum, MD  fluticasone (FLONASE) 50 MCG/ACT nasal spray Place 1 spray into both nostrils daily.    [provider]  meloxicam (MOBIC) 15 MG tablet Take 1 tablet (15 mg total) by mouth daily as needed for pain. 07/12/16   Renford Dills, NP  meloxicam (MOBIC) 15 MG tablet Take 1 tablet (15 mg total) by mouth daily. 06/11/17   Hassan Rowan, MD  oxyCODONE-acetaminophen (ROXICET) 5-325 MG tablet Take 1 tablet by mouth every 8 (eight) hours as needed for moderate pain or severe pain (Do not drive or operate heavy machinery while taking  as can cause drowsiness.). 07/12/16   Renford Dills, NP  pantoprazole (PROTONIX) 40 MG tablet Take 1 tablet (40 mg total) by mouth daily. 04/15/17 04/15/18  Minna Antis, MD  SUMAtriptan (IMITREX) 50 MG tablet Take 50 mg by mouth. Take 1 tablet ( ) by mouth every two hours as needed for migraine. 02/03/16   [provider]  traMADol (ULTRAM) 50 MG tablet Take 1 tablet (50 mg total) by mouth every 6 (six) hours as needed. 04/15/17   Minna Antis, MD    Family History Family History  Problem Relation Age of Onset  . Hypertension Mother   . Cancer Father     Social History Social History  Substance Use Topics  . Smoking  status: Never Smoker  . Smokeless tobacco: Never Used  . Alcohol use No     Allergies   Patient has no known allergies.   Review of Systems Review of Systems  Respiratory: Negative for shortness of breath.   Cardiovascular: Negative for chest pain.  Gastrointestinal: Negative for abdominal pain.  Musculoskeletal: Positive for gait problem, joint swelling and myalgias.  Skin: Negative for color change.  Neurological: Negative for headaches.  All other systems reviewed and are negative.    Physical Exam Triage Vital Signs ED Triage Vitals  Enc Vitals Group     BP 06/11/17 1316 (!) 167/95     Pulse Rate 06/11/17 1316 66     Resp 06/11/17 1316 16     Temp 06/11/17 1316 98.7 F (37.1 C)     Temp Source 06/11/17 1316 Oral     SpO2 06/11/17 1316 100 %     Weight 06/11/17 1316 225 lb (102.1 kg)     Height 06/11/17 1316  (1.6 m)     Head Circumference --      Peak Flow --      Pain Score 06/11/17 1318 6     Pain Loc --      Pain Edu? --      Excl. in GC? --    No data found.   Updated Vital Signs BP (!) 167/95 (BP Location: Left Arm) Comment: hasn't taken B/P meds yet today  Pulse 66   Temp 98.7 F (37.1 C) (Oral)   Resp 16   Ht  (1.6 m)   Wt 225 lb (102.1 kg)   SpO2 100%   BMI 39.86 kg/m   Visual Acuity Right Eye Distance:   Left Eye Distance:   Bilateral Distance:    Right Eye Near:   Left Eye Near:    Bilateral Near:     Physical Exam  Constitutional: She is oriented to person, place, and time. She appears well-developed and well-nourished.  HENT:  Head: Normocephalic and atraumatic.  Eyes: Pupils are equal, round, and reactive to light.  Neck: Normal range of motion. Neck supple.  Pulmonary/Chest: Effort normal.  Musculoskeletal: She exhibits edema and tenderness.       Left ankle: She exhibits swelling. She exhibits no deformity, no laceration and normal pulse. Tenderness. Lateral malleolus tenderness found. Achilles tendon exhibits no  pain, no defect and normal Thompson's test results.       Feet:  There is no tenderness over the left Achilles and all good range of motion there she does have some swelling over the left lateral malleolus more over the posterior malleolus but once again there is some tenderness but not marked. At this time will recommend placing the ankle splint if x-rays negative  Neurological: She is alert and oriented to person, place, and time.  Skin: Skin is warm.  Psychiatric: She has a normal mood and affect.  Vitals reviewed.    UC Treatments / Results  Labs (all labs ordered are listed, but only abnormal results are displayed) Labs Reviewed - No data to display  EKG  EKG Interpretation None       Radiology Dg Ankle Complete Left  Result Date: 06/11/2017 CLINICAL DATA:  Acute left ankle pain for 1 week. Initial encounter. EXAM: LEFT ANKLE COMPLETE - 3+ VIEW COMPARISON:  07/16/2015 and prior radiographs FINDINGS: There is no evidence of fracture, dislocation, or joint effusion. There is no evidence of arthropathy or other focal bone abnormality. Soft tissues are unremarkable. IMPRESSION: Negative. Electronically Signed   By: Harmon Pier M.D.   On: 06/11/2017 14:08    Procedures Procedures (including critical care time)  Medications Ordered in UC Medications - No data to display   Initial Impression / Assessment and Plan / UC Course  I have reviewed the triage vital signs and the nursing notes.  Pertinent labs & imaging results that were available during my care of the patient were reviewed by me and considered in my medical decision making (see chart for details).    Difficulty recommending a boot forced ankles sprain will recommend instead ankle splint Mobic 15 mg 1 tablet day follow podiatrist needed 1-2 weeks with token for Saturday and Sunday.   Final Clinical Impressions(s) / UC Diagnoses   Final diagnoses:  Sprain of left ankle, unspecified ligament, initial encounter     New Prescriptions New Prescriptions   MELOXICAM (MOBIC) 15 MG TABLET    Take 1 tablet (15 mg total) by mouth daily.   Note: This dictation was prepared with Dragon dictation along with smaller phrase technology. Any transcriptional errors that result from this process are unintentional. Controlled Substance Prescriptions [place] Controlled Substance Registry consulted? Not Applicable   Hassan Rowan, MD 06/11/17 1421

## 2017-06-11 NOTE — ED Triage Notes (Addendum)
Pt with two days of heel pain radiating into her achilles on left foot. "I tore a ligament there a few years ago." Pain 6/10 and exacerbated by standing for long periods. No difficulty walking in triage

## 2017-07-09 ENCOUNTER — Ambulatory Visit
Admission: EM | Admit: 2017-07-09 | Discharge: 2017-07-09 | Disposition: A | Discharge status: Home / Self Care | Payer: BLUE CROSS/BLUE SHIELD | Attending: Family Medicine | Admitting: Family Medicine

## 2017-07-09 DIAGNOSIS — B349 Viral infection, unspecified: Secondary | ICD-10-CM | POA: Diagnosis not present

## 2017-07-09 DIAGNOSIS — R112 Nausea with vomiting, unspecified: Secondary | ICD-10-CM | POA: Diagnosis not present

## 2017-07-09 DIAGNOSIS — R52 Pain, unspecified: Secondary | ICD-10-CM | POA: Diagnosis not present

## 2017-07-09 LAB — RAPID INFLUENZA A&B ANTIGENS: Influenza A (ARMC): NEGATIVE

## 2017-07-09 LAB — RAPID INFLUENZA A&B ANTIGENS (ARMC ONLY): INFLUENZA B (ARMC): NEGATIVE

## 2017-07-09 MED ORDER — OSELTAMIVIR PHOSPHATE 75 MG PO CAPS: 75.0000 mg | ORAL_CAPSULE | Freq: Two times a day (BID) | ORAL | 0 refills | Status: DC

## 2017-07-09 MED ORDER — CETIRIZINE HCL 10 MG PO TABS
10.0000 mg | ORAL_TABLET | Freq: Every day | ORAL | 1 refills | Status: DC
Start: 2017-07-09 — End: 2019-03-06

## 2017-07-09 MED ORDER — ONDANSETRON 8 MG PO TBDP
8.0000 mg | ORAL_TABLET | Freq: Once | ORAL | Status: AC
  Administered 2017-07-09: 8 mg via ORAL

## 2017-07-09 MED ORDER — ONDANSETRON 8 MG PO TBDP: 8.0000 mg | ORAL_TABLET | Freq: Three times a day (TID) | ORAL | 0 refills | Status: DC | PRN

## 2017-07-09 MED ORDER — FLUTICASONE PROPIONATE 50 MCG/ACT NA SUSP: 1.0000 | Freq: Every day | NASAL | 1 refills | Status: DC

## 2017-07-09 NOTE — ED Triage Notes (Signed)
Started with runny nose earlier in the week. Temp has been around 99 degrees. Today with generalized body aches and headache 6/10. Has had nausea, no vomiting. Had her flu shot .

## 2017-07-09 NOTE — ED Provider Notes (Signed)
[facility]    CSN: [id number] Arrival date & time: 07/09/17  [id number]     History   Chief Complaint Chief Complaint  Patient presents with  . Generalized Body Aches    HPI Taylor Mercer is a 31 y.o. female.   Patient is a 31 year old female with past history of seasonal allergies, hypertension, and migraines who presents with complaint of flulike symptoms. Patient reports she started with runny nose on Wednesday and has since developed bodyaches as well as upset stomach, vomiting, and left ear pain. Patient reports her stomach symptoms began last night. She reports having temperatures around 99.4 home but states she has been taken ibuprofen regularly every 4-6 hours. Patient denies chest pain or shortness of breath. Patient does have a history of high blood pressure but states she has not taken her chlorthalidone or amlodipine this morning. Patient reports she does have some seasonal allergy issues but only takes her Flonase and Zyrtec as needed. Patient also reports taking Prilosec as needed as well. Patient states he has taken a generic Alka-Seltzer cold and flu medication to help with her symptoms.      Past Medical History:  Diagnosis Date  . Endometriosis   . Hypertension   . Kidney stones   . Kidney stones   . Migraine     There are no active problems to display for this patient.   Past Surgical History:  Procedure Laterality Date  . ABDOMINAL HYSTERECTOMY    . APPENDECTOMY    . CESAREAN SECTION      OB History    No data available       Home Medications    Prior to Admission medications   Medication Sig Start Date End Date Taking? Authorizing Provider  amLODipine (NORVASC) 10 MG tablet Take 10 mg by mouth daily.    [provider]  amLODipine (NORVASC) 5 MG tablet Take 5 mg by mouth daily.    [provider]  azelastine (ASTELIN) 0.1 % nasal spray Place 1 spray into both nostrils 2 (two) times daily. 12/11/15   [provider]  cetirizine (ZYRTEC) 10 MG tablet Take 1 tablet (10 mg total) by mouth daily. 07/09/17   Candis SchatzHarris, Michael D, PA-C  chlorthalidone (HYGROTON) 25 MG tablet Take 1 tablet (25 mg total) by mouth daily. 07/16/15   Jeanmarie PlantMcShane, James A, MD  cyclobenzaprine (FLEXERIL) 10 MG tablet Take 1 tablet (10 mg total) by mouth 3 (three) times daily as needed for muscle spasms. 01/19/17   [name], [place], MD  fluticasone (FLONASE) 50 MCG/ACT nasal spray Place 1 spray into both nostrils daily. 07/09/17   Candis SchatzHarris, Michael D, PA-C  meloxicam (MOBIC) 15 MG tablet Take 1 tablet (15 mg total) by mouth daily as needed for pain. 07/12/16   Renford DillsMiller, Lindsey, NP  meloxicam (MOBIC) 15 MG tablet Take 1 tablet (15 mg total) by mouth daily. 06/11/17   Hassan RowanWade, Eugene, MD  ondansetron (ZOFRAN ODT) 8 MG disintegrating tablet Take 1 tablet (8 mg total) by mouth every 8 (eight) hours as needed for nausea or vomiting. 07/09/17   Candis SchatzHarris, Michael D, PA-C  oseltamivir (TAMIFLU) 75 MG capsule Take 1 capsule (75 mg total) by mouth every 12 (twelve) hours. 07/09/17   Candis SchatzHarris, Michael D, PA-C  oxyCODONE-acetaminophen (ROXICET) 5-325 MG tablet Take 1 tablet by mouth every 8 (eight) hours as needed for moderate pain or severe pain (Do not drive or operate heavy machinery while taking as can cause drowsiness.). 07/12/16   [name],  Mardella Layman, NP  pantoprazole (PROTONIX) 40 MG tablet Take 1 tablet (40 mg total) by mouth daily. 04/15/17 04/15/18  Minna Antis, MD  SUMAtriptan (IMITREX) 50 MG tablet Take 50 mg by mouth. Take 1 tablet (50mg ) by mouth every two hours as needed for migraine. 02/03/16   [provider]  traMADol (ULTRAM) 50 MG tablet Take 1 tablet (50 mg total) by mouth every 6 (six) hours as needed. 04/15/17   Minna Antis, MD    Family History Family History  Problem Relation Age of Onset  . Hypertension Mother   . Cancer Father     Social History Social History  Substance Use Topics  . Smoking status: Never Smoker    . Smokeless tobacco: Never Used  . Alcohol use No     Allergies   Patient has no known allergies.   Review of Systems Review of Systems  As noted above in history of present illness. Other systems reviewed and found to be negative   Physical Exam Triage Vital Signs ED Triage Vitals  Enc Vitals Group     BP 07/09/17 0925 (!) 160/104     Pulse Rate 07/09/17 0925 89     Resp 07/09/17 0925 18     Temp 07/09/17 0925 98.6 F (37 C)     Temp Source 07/09/17 0925 Oral     SpO2 07/09/17 0925 99 %     Weight 07/09/17 0924 225 lb (102.1 kg)     Height 07/09/17 0924 5\' 3"  (1.6 m)     Head Circumference --      Peak Flow --      Pain Score 07/09/17 0927 6     Pain Loc --      Pain Edu? --      Excl. in GC? --    No data found.   Updated Vital Signs BP (!) 160/104 (BP Location: Left Arm)   Pulse 89   Temp 98.6 F (37 C) (Oral)   Resp 18   Ht 5\' 3"  (1.6 m)   Wt 225 lb (102.1 kg)   SpO2 99%   BMI 39.86 kg/m   Visual Acuity Right Eye Distance:   Left Eye Distance:   Bilateral Distance:    Right Eye Near:   Left Eye Near:    Bilateral Near:     Physical Exam  Constitutional: She is oriented to person, place, and time. She appears well-developed and well-nourished.  Ill appearing. Holding a emesis bag. Patient reports vomiting in trash can of the clinic room.  HENT:  Head: Normocephalic and atraumatic.  Left Ear: Tympanic membrane is bulging. A middle ear effusion is present.  Nose: Mucosal edema and rhinorrhea present. Right sinus exhibits frontal sinus tenderness. Right sinus exhibits no maxillary sinus tenderness. Left sinus exhibits frontal sinus tenderness. Left sinus exhibits no maxillary sinus tenderness.  Mouth/Throat: Uvula is midline and mucous membranes are normal. Posterior oropharyngeal erythema present. Tonsils are 0 on the right. Tonsils are 0 on the left.  Right ear with tympanostomy tube in place  Eyes: Pupils are equal, round, and reactive to light.  EOM are normal.  Neck: Normal range of motion.  Cardiovascular: Normal rate, regular rhythm and normal heart sounds.   Pulmonary/Chest: Effort normal and breath sounds normal. No respiratory distress. She has no wheezes.  Abdominal: Soft. Bowel sounds are normal.  Musculoskeletal: Normal range of motion. She exhibits no edema.  Lymphadenopathy:    She has cervical adenopathy.  Neurological: She is oriented  to person, place, and time. No cranial nerve deficit.  Skin: Skin is warm and dry.     UC Treatments / Results  Labs (all labs ordered are listed, but only abnormal results are displayed) Labs Reviewed  RAPID INFLUENZA A&B ANTIGENS ([facility] ONLY)    EKG  EKG Interpretation None       Radiology No results found.  Procedures Procedures (including critical care time)  Medications Ordered in UC Medications  ondansetron (ZOFRAN-ODT) disintegrating tablet 8 mg (8 mg Oral Given 07/09/17 0948)     Initial Impression / Assessment and Plan / UC Course  I have reviewed the triage vital signs and the nursing notes.  Pertinent labs & imaging results that were available during my care of the patient were reviewed by me and considered in my medical decision making (see chart for details).     Seasonal allergies with a viral respiratory infection, possibly flu, on top of it. Rapid flu done. Patient with exertion allergy medications as needed. Patient also reports vomiting in the room.  Final Clinical Impressions(s) / UC Diagnoses   Final diagnoses:  Viral syndrome  Body aches  Nausea and vomiting, intractability of vomiting not specified, unspecified vomiting type   Patient with worsening flulike symptoms, upper history symptoms, body aches, fever, and new abdominal symptoms. We'll go ahead and treat her with Tamiflu, give her prescription for Zofran for her nausea, as well as give her prescriptions for Zyrtec for her upper respiratory symptoms. Patient verbalized understanding  is in agreement with the plan.  New Prescriptions Discharge Medication List as of 07/09/2017  9:56 AM    START taking these medications   Details  cetirizine (ZYRTEC) 10 MG tablet Take 1 tablet (10 mg total) by mouth daily., Starting Sat 07/09/2017, Normal    ondansetron (ZOFRAN ODT) 8 MG disintegrating tablet Take 1 tablet (8 mg total) by mouth every 8 (eight) hours as needed for nausea or vomiting., Starting Sat 07/09/2017, Normal    oseltamivir (TAMIFLU) 75 MG capsule Take 1 capsule (75 mg total) by mouth every 12 (twelve) hours., Starting Sat 07/09/2017, Normal         Controlled Substance Prescriptions [place] Controlled Substance Registry consulted? Not Applicable   Candis Schatz, PA-C 07/09/17 1140

## 2017-07-09 NOTE — Discharge Instructions (Signed)
-  Tamiflu: one tablet twice a day for 5 days -Zyrtec and Flonase: resume taking these medications for symptoms and to help clear passages. New prescriptions given. Would continue taking for at least a month -can use OTC Tylenol and Ibuprofen for pain and fever -Rest and fluids -if continue taking the ibuprofen regularly, you should also take your Prilosec during that time as well -return to clinic if symptoms worsen or not improve

## 2017-08-01 DIAGNOSIS — R5383 Other fatigue: Secondary | ICD-10-CM | POA: Insufficient documentation

## 2017-08-01 DIAGNOSIS — Z20828 Contact with and (suspected) exposure to other viral communicable diseases: Secondary | ICD-10-CM | POA: Insufficient documentation

## 2017-08-01 DIAGNOSIS — R509 Fever, unspecified: Secondary | ICD-10-CM | POA: Insufficient documentation

## 2017-09-09 DIAGNOSIS — J309 Allergic rhinitis, unspecified: Secondary | ICD-10-CM | POA: Insufficient documentation

## 2018-04-26 ENCOUNTER — Other Ambulatory Visit: Payer: Self-pay

## 2018-04-26 ENCOUNTER — Encounter: Payer: Self-pay | Admitting: Emergency Medicine

## 2018-04-26 ENCOUNTER — Emergency Department
Admission: EM | Admit: 2018-04-26 | Discharge: 2018-04-26 | Disposition: A | Discharge status: Home / Self Care | Payer: BLUE CROSS/BLUE SHIELD | Attending: Emergency Medicine | Admitting: Emergency Medicine

## 2018-04-26 DIAGNOSIS — G43801 Other migraine, not intractable, with status migrainosus: Secondary | ICD-10-CM | POA: Insufficient documentation

## 2018-04-26 DIAGNOSIS — R112 Nausea with vomiting, unspecified: Secondary | ICD-10-CM | POA: Insufficient documentation

## 2018-04-26 DIAGNOSIS — Z79899 Other long term (current) drug therapy: Secondary | ICD-10-CM | POA: Insufficient documentation

## 2018-04-26 DIAGNOSIS — H53149 Visual discomfort, unspecified: Secondary | ICD-10-CM | POA: Diagnosis not present

## 2018-04-26 DIAGNOSIS — I10 Essential (primary) hypertension: Secondary | ICD-10-CM | POA: Insufficient documentation

## 2018-04-26 DIAGNOSIS — R51 Headache: Secondary | ICD-10-CM | POA: Diagnosis present

## 2018-04-26 LAB — COMPREHENSIVE METABOLIC PANEL
ALK PHOS: 83 U/L (ref 38–126)
ALT: 28 U/L (ref 0–44)
AST: 22 U/L (ref 15–41)
Albumin: 4.8 g/dL (ref 3.5–5.0)
Anion gap: 11 (ref 5–15)
BILIRUBIN TOTAL: 0.9 mg/dL (ref 0.3–1.2)
BUN: 13 mg/dL (ref 6–20)
CO2: 27 mmol/L (ref 22–32)
Calcium: 9.6 mg/dL (ref 8.9–10.3)
Chloride: 103 mmol/L (ref 98–111)
Creatinine, Ser: 0.84 mg/dL (ref 0.44–1.00)
GFR calc Af Amer: >60 mL/min (ref >60)
Glucose, Bld: 132 mg/dL — ABNORMAL HIGH (ref 70–99)
Potassium: 4.2 mmol/L (ref 3.5–5.1)
Sodium: 141 mmol/L (ref 135–145)
Total Protein: 9 g/dL — ABNORMAL HIGH (ref 6.5–8.1)

## 2018-04-26 LAB — CBC
HCT: 44.3 % (ref 35.0–47.0)
Hemoglobin: 15.7 g/dL (ref 12.0–16.0)
MCH: 30 pg (ref 26.0–34.0)
MCHC: 35.4 g/dL (ref 32.0–36.0)
MCV: 84.7 fL (ref 80.0–100.0)
PLATELETS: 264 10*3/uL (ref 150–440)
RBC: 5.23 MIL/uL — ABNORMAL HIGH (ref 3.80–5.20)
RDW: 13 % (ref 11.5–14.5)
WBC: 9.8 10*3/uL (ref 3.6–11.0)

## 2018-04-26 LAB — LIPASE, BLOOD: Lipase: 24 U/L (ref 11–51)

## 2018-04-26 MED ORDER — SODIUM CHLORIDE 0.9 % IV BOLUS
1000.0000 mL | Freq: Once | INTRAVENOUS | Status: AC
  Administered 2018-04-26: 1000 mL via INTRAVENOUS

## 2018-04-26 MED ORDER — ONDANSETRON HCL 4 MG/2ML IJ SOLN
4.0000 mg | Freq: Once | INTRAMUSCULAR | Status: AC
  Administered 2018-04-26: 4 mg via INTRAVENOUS
  Filled 2018-04-26: qty 2

## 2018-04-26 MED ORDER — METOCLOPRAMIDE HCL 10 MG PO TABS: 10.0000 mg | ORAL_TABLET | Freq: Four times a day (QID) | ORAL | 0 refills | Status: DC | PRN

## 2018-04-26 MED ORDER — FAMOTIDINE 20 MG PO TABS: 20.0000 mg | ORAL_TABLET | Freq: Two times a day (BID) | ORAL | 0 refills | Status: DC

## 2018-04-26 MED ORDER — KETOROLAC TROMETHAMINE 30 MG/ML IJ SOLN
15.0000 mg | INTRAMUSCULAR | Status: AC
  Administered 2018-04-26: 15 mg via INTRAVENOUS
  Filled 2018-04-26: qty 1

## 2018-04-26 MED ORDER — DIPHENHYDRAMINE HCL 50 MG/ML IJ SOLN
25.0000 mg | Freq: Once | INTRAMUSCULAR | Status: AC
  Administered 2018-04-26: 25 mg via INTRAVENOUS
  Filled 2018-04-26: qty 1

## 2018-04-26 MED ORDER — METOCLOPRAMIDE HCL 5 MG/ML IJ SOLN
10.0000 mg | Freq: Once | INTRAMUSCULAR | Status: AC
  Administered 2018-04-26: 10 mg via INTRAVENOUS
  Filled 2018-04-26: qty 2

## 2018-04-26 NOTE — ED Triage Notes (Signed)
Pt states headache began last night with vomiting, has taken her migraine meds with no relief. Appears in NAD.

## 2018-04-26 NOTE — ED Provider Notes (Signed)
[facility] Emergency Department Provider Note  ____________________________________________  Time seen: Approximately 2:31 PM  I have reviewed the triage vital signs and the nursing notes.   HISTORY  Chief Complaint Emesis and Headache    HPI Taylor Mercer is a 32 y.o. female planes of bilateral frontal headache that started last night, gradual onset, nonradiating, no aggravating or alleviating factors, associated with photophobia.  Feels like her usual migraines.  She tried taking her Imitrex without relief.  Has nausea and vomiting as well and unable to take her other medicines.  Denies any vision changes paresthesias or weakness.  No recent trauma.  No fevers chills or neck stiffness.      Past Medical History:  Diagnosis Date  . Endometriosis   . Hypertension   . Kidney stones   . Kidney stones   . Migraine      There are no active problems to display for this patient.    Past Surgical History:  Procedure Laterality Date  . ABDOMINAL HYSTERECTOMY    . APPENDECTOMY    . CESAREAN SECTION       Prior to Admission medications   Medication Sig Start Date End Date Taking? Authorizing Provider  amLODipine (NORVASC) 10 MG tablet Take 10 mg by mouth daily.    [provider]  amLODipine (NORVASC) 5 MG tablet Take 5 mg by mouth daily.    [provider]  azelastine (ASTELIN) 0.1 % nasal spray Place 1 spray into both nostrils 2 (two) times daily. 12/11/15   [provider]  cetirizine (ZYRTEC) 10 MG tablet Take 1 tablet (10 mg total) by mouth daily. 07/09/17   Candis SchatzHarris, Michael D, PA-C  chlorthalidone (HYGROTON) 25 MG tablet Take 1 tablet (25 mg total) by mouth daily. 07/16/15   Jeanmarie PlantMcShane, James A, MD  cyclobenzaprine (FLEXERIL) 10 MG tablet Take 1 tablet (10 mg total) by mouth 3 (three) times daily as needed for muscle spasms. 01/19/17   [name], Orlando, MD  famotidine (PEPCID) 20 MG tablet Take 1 tablet (20 mg total) by  mouth 2 (two) times daily. 04/26/18   Sharman CheekStafford, Zivah Mayr, MD  fluticasone [facility](FLONASE) 50 MCG/ACT nasal spray Place 1 spray into both nostrils daily. 07/09/17   Candis SchatzHarris, Michael D, PA-C  meloxicam (MOBIC) 15 MG tablet Take 1 tablet (15 mg total) by mouth daily as needed for pain. 07/12/16   Renford DillsMiller, Lindsey, NP  meloxicam (MOBIC) 15 MG tablet Take 1 tablet (15 mg total) by mouth daily. 06/11/17   Hassan RowanWade, Eugene, MD  metoCLOPramide (REGLAN) 10 MG tablet Take 1 tablet (10 mg total) by mouth every 6 (six) hours as needed. 04/26/18   Sharman CheekStafford, Mirna Sutcliffe, MD  ondansetron (ZOFRAN ODT) 8 MG disintegrating tablet Take 1 tablet (8 mg total) by mouth every 8 (eight) hours as needed for nausea or vomiting. 07/09/17   Candis SchatzHarris, Michael D, PA-C  oseltamivir (TAMIFLU) 75 MG capsule Take 1 capsule (75 mg total) by mouth every 12 (twelve) hours. 07/09/17   Candis SchatzHarris, Michael D, PA-C  oxyCODONE-acetaminophen (ROXICET) 5-325 MG tablet Take 1 tablet by mouth every 8 (eight) hours as needed for moderate pain or severe pain (Do not drive or operate heavy machinery while taking as can cause drowsiness.). 07/12/16   Renford DillsMiller, Lindsey, NP  pantoprazole (PROTONIX) 40 MG tablet Take 1 tablet (40 mg total) by mouth daily. 04/15/17 04/15/18  Minna AntisPaduchowski, Kevin, MD  SUMAtriptan (IMITREX) 50 MG tablet Take 50 mg by mouth. Take 1 tablet (50mg ) by mouth every two hours  as needed for migraine. 02/03/16   [provider]  traMADol (ULTRAM) 50 MG tablet Take 1 tablet (50 mg total) by mouth every 6 (six) hours as needed. 04/15/17   Minna AntisPaduchowski, Kevin, MD     Allergies Patient has no known allergies.   Family History  Problem Relation Age of Onset  . Hypertension Mother   . Cancer Father     Social History Social History   Tobacco Use  . Smoking status: Never Smoker  . Smokeless tobacco: Never Used  Substance Use Topics  . Alcohol use: No  . Drug use: No    Review of Systems  Constitutional:   No fever or chills.  ENT:   No sore  throat. No rhinorrhea. Cardiovascular:   No chest pain or syncope. Respiratory:   No dyspnea or cough. Gastrointestinal:   Negative for abdominal pain, vomiting and diarrhea.  Musculoskeletal:   Negative for focal pain or swelling All other systems reviewed and are negative except as documented above in ROS and HPI.  ____________________________________________   PHYSICAL EXAM:  VITAL SIGNS: ED Triage Vitals  Enc Vitals Group     BP 04/26/18 1052 (!) 159/108     Pulse Rate 04/26/18 1052 (!) 106     Resp 04/26/18 1052 18     Temp --      Temp src --      SpO2 04/26/18 1052 99 %     Weight 04/26/18 1051 215 lb (97.5 kg)     Height 04/26/18 1051 5\' 3"  (1.6 m)     Head Circumference --      Peak Flow --      Pain Score 04/26/18 1050 8     Pain Loc --      Pain Edu? --      Excl. in GC? --     Vital signs reviewed, nursing assessments reviewed.   Constitutional:   Alert and oriented. Non-toxic appearance. Eyes:   Conjunctivae are normal. EOMI. PERRL.  Positive photophobia on exam ENT      Head:   Normocephalic and atraumatic.      Nose:   No congestion/rhinnorhea.       Mouth/Throat:   MMM, no pharyngeal erythema. No peritonsillar mass.       Neck:   No meningismus. Full ROM.  No midline tenderness Hematological/Lymphatic/Immunilogical:   No cervical lymphadenopathy. Cardiovascular:   RRR. Symmetric bilateral radial and DP pulses.  No murmurs. Cap refill less than 2 seconds. Respiratory:   Normal respiratory effort without tachypnea/retractions. Breath sounds are clear and equal bilaterally. No wheezes/rales/rhonchi. Gastrointestinal:   Soft and nontender. Non distended. There is no CVA tenderness.  No rebound, rigidity, or guarding. Musculoskeletal:   Normal range of motion in all extremities. No joint effusions.  No lower extremity tenderness.  No edema. Neurologic:   Normal speech and language.  Motor grossly intact. No pronator drift Normal gait No acute focal  neurologic deficits are appreciated.  Skin:    Skin is warm, dry and intact. No rash noted.  No petechiae, purpura, or bullae.  ____________________________________________    LABS (pertinent positives/negatives) (all labs ordered are listed, but only abnormal results are displayed) Labs Reviewed  COMPREHENSIVE METABOLIC PANEL - Abnormal; Notable for the following components:      Result Value   Glucose, Bld 132 (*)    Total Protein 9.0 (*)    All other components within normal limits  CBC - Abnormal; Notable for the following components:  RBC 5.23 (*)    All other components within normal limits  LIPASE, BLOOD  URINALYSIS, COMPLETE (UACMP) WITH MICROSCOPIC   ____________________________________________   EKG    ____________________________________________    RADIOLOGY  No results found.  ____________________________________________   PROCEDURES Procedures  ____________________________________________    CLINICAL IMPRESSION / ASSESSMENT AND PLAN / ED COURSE  Pertinent labs & imaging results that were available during my care of the patient were reviewed by me and considered in my medical decision making (see chart for details).    Patient presents with headache consistent with her usual migraine pattern.  No atypical features.  Exam is unremarkable.  Vital signs are reassuring.  Treat with Toradol Reglan Benadryl for her migraine.  ----------------------------------------- 2:32 PM on 04/26/2018 -----------------------------------------  Patient feels much better and symptoms have resolved.  She is eager to be discharged home with her brother who can drive her back.  Refer back to outpatient follow-up, discharge in good condition     ____________________________________________   FINAL CLINICAL IMPRESSION(S) / ED DIAGNOSES    Final diagnoses:  Other migraine with status migrainosus, not intractable     ED Discharge Orders         Ordered     metoCLOPramide (REGLAN) 10 MG tablet  Every 6 hours PRN     04/26/18 1430    famotidine (PEPCID) 20 MG tablet  2 times daily     04/26/18 1430          Portions of this note were generated with dragon dictation software. Dictation errors may occur despite best attempts at proofreading.    Sharman Cheek, MD 04/26/18 [phone]

## 2019-03-01 ENCOUNTER — Other Ambulatory Visit: Payer: Self-pay | Admitting: Urology

## 2019-03-01 ENCOUNTER — Emergency Department
Admission: EM | Admit: 2019-03-01 | Discharge: 2019-03-01 | Disposition: A | Discharge status: Home / Self Care | Payer: BC Managed Care – PPO | Attending: Emergency Medicine | Admitting: Emergency Medicine

## 2019-03-01 ENCOUNTER — Emergency Department: Payer: BC Managed Care – PPO

## 2019-03-01 ENCOUNTER — Other Ambulatory Visit: Payer: Self-pay

## 2019-03-01 ENCOUNTER — Encounter: Payer: Self-pay | Admitting: Emergency Medicine

## 2019-03-01 DIAGNOSIS — R109 Unspecified abdominal pain: Secondary | ICD-10-CM | POA: Diagnosis present

## 2019-03-01 DIAGNOSIS — Z79899 Other long term (current) drug therapy: Secondary | ICD-10-CM | POA: Insufficient documentation

## 2019-03-01 DIAGNOSIS — I10 Essential (primary) hypertension: Secondary | ICD-10-CM | POA: Insufficient documentation

## 2019-03-01 DIAGNOSIS — N201 Calculus of ureter: Secondary | ICD-10-CM | POA: Diagnosis not present

## 2019-03-01 LAB — URINALYSIS, COMPLETE (UACMP) WITH MICROSCOPIC
Bacteria, UA: NONE SEEN
Bilirubin Urine: NEGATIVE
Glucose, UA: NEGATIVE mg/dL
Ketones, ur: NEGATIVE mg/dL
Leukocytes,Ua: NEGATIVE
Nitrite: NEGATIVE
Protein, ur: 100 mg/dL — AB
RBC / HPF: >50 RBC/hpf — ABNORMAL HIGH (ref 0–5)
Specific Gravity, Urine: 1.02 (ref 1.005–1.030)
pH: 5 (ref 5.0–8.0)

## 2019-03-01 LAB — CBC WITH DIFFERENTIAL/PLATELET
Abs Immature Granulocytes: 0.04 10*3/uL (ref 0.00–0.07)
Basophils Absolute: 0 10*3/uL (ref 0.0–0.1)
Basophils Relative: 0 %
Eosinophils Absolute: 0.1 10*3/uL (ref 0.0–0.5)
Eosinophils Relative: 1 %
HCT: 37.5 % (ref 36.0–46.0)
Hemoglobin: 13 g/dL (ref 12.0–15.0)
Immature Granulocytes: 1 %
Lymphocytes Relative: 22 %
Lymphs Abs: 1.9 10*3/uL (ref 0.7–4.0)
MCH: 28.4 pg (ref 26.0–34.0)
MCHC: 34.7 g/dL (ref 30.0–36.0)
MCV: 82.1 fL (ref 80.0–100.0)
Monocytes Absolute: 0.3 10*3/uL (ref 0.1–1.0)
Monocytes Relative: 4 %
Neutro Abs: 6.3 10*3/uL (ref 1.7–7.7)
Neutrophils Relative %: 72 %
Platelets: 251 10*3/uL (ref 150–400)
RBC: 4.57 MIL/uL (ref 3.87–5.11)
RDW: 12.5 % (ref 11.5–15.5)
WBC: 8.6 10*3/uL (ref 4.0–10.5)
nRBC: 0 % (ref 0.0–0.2)

## 2019-03-01 LAB — COMPREHENSIVE METABOLIC PANEL
ALT: 36 U/L (ref 0–44)
AST: 28 U/L (ref 15–41)
Albumin: 4 g/dL (ref 3.5–5.0)
Alkaline Phosphatase: 69 U/L (ref 38–126)
Anion gap: 9 (ref 5–15)
BUN: 14 mg/dL (ref 6–20)
CO2: 23 mmol/L (ref 22–32)
Calcium: 9.3 mg/dL (ref 8.9–10.3)
Chloride: 106 mmol/L (ref 98–111)
Creatinine, Ser: 0.93 mg/dL (ref 0.44–1.00)
GFR calc Af Amer: >60 mL/min (ref >60)
GFR calc non Af Amer: >60 mL/min (ref >60)
Glucose, Bld: 145 mg/dL — ABNORMAL HIGH (ref 70–99)
Potassium: 3.6 mmol/L (ref 3.5–5.1)
Sodium: 138 mmol/L (ref 135–145)
Total Bilirubin: 0.4 mg/dL (ref 0.3–1.2)
Total Protein: 7.9 g/dL (ref 6.5–8.1)

## 2019-03-01 LAB — LIPASE, BLOOD: Lipase: 26 U/L (ref 11–51)

## 2019-03-01 MED ORDER — ONDANSETRON 4 MG PO TBDP: 4.0000 mg | ORAL_TABLET | Freq: Three times a day (TID) | ORAL | 0 refills | Status: DC | PRN

## 2019-03-01 MED ORDER — MORPHINE SULFATE (PF) 4 MG/ML IV SOLN
4.0000 mg | Freq: Once | INTRAVENOUS | Status: AC
  Administered 2019-03-01: 10:00:00 4 mg via INTRAVENOUS
  Filled 2019-03-01: qty 1

## 2019-03-01 MED ORDER — HYDROCODONE-ACETAMINOPHEN 5-325 MG PO TABS: 1.0000 | ORAL_TABLET | Freq: Four times a day (QID) | ORAL | 0 refills | Status: DC | PRN

## 2019-03-01 MED ORDER — NAPROXEN 500 MG PO TABS: 500.0000 mg | ORAL_TABLET | Freq: Two times a day (BID) | ORAL | 2 refills | Status: DC

## 2019-03-01 MED ORDER — ONDANSETRON HCL 4 MG/2ML IJ SOLN
4.0000 mg | Freq: Once | INTRAMUSCULAR | Status: AC
  Administered 2019-03-01: 08:00:00 4 mg via INTRAVENOUS
  Filled 2019-03-01: qty 2

## 2019-03-01 MED ORDER — KETOROLAC TROMETHAMINE 30 MG/ML IJ SOLN
30.0000 mg | Freq: Once | INTRAMUSCULAR | Status: AC
  Administered 2019-03-01: 08:00:00 30 mg via INTRAVENOUS
  Filled 2019-03-01: qty 1

## 2019-03-01 MED ORDER — SODIUM CHLORIDE 0.9 % IV SOLN
1000.0000 mL | Freq: Once | INTRAVENOUS | Status: AC
  Administered 2019-03-01: 1000 mL via INTRAVENOUS

## 2019-03-01 MED ORDER — TAMSULOSIN HCL 0.4 MG PO CAPS: 0.4000 mg | ORAL_CAPSULE | Freq: Every day | ORAL | 0 refills | Status: DC

## 2019-03-01 NOTE — ED Notes (Signed)
Pt up to the bathroom at this time.

## 2019-03-01 NOTE — ED Notes (Signed)
NAD noted time of D/C. Driving precautions reviewed with patient at this time. Pt D/C to the lobby to wait for her brother who states via telephone he is close. Pt given urine strainer. Denies comments/concerns regarding D/C instructions.

## 2019-03-01 NOTE — ED Triage Notes (Signed)
Patient ambulatory to triage with steady gait, without difficulty or distress noted, mask in place; pt reports since 430am having left lower abd pain, nonradiating accomp by nausea; denies hx of same

## 2019-03-01 NOTE — ED Provider Notes (Signed)
[facility] Emergency Department Provider Note   ____________________________________________    I have reviewed the triage vital signs and the nursing notes.   HISTORY  Chief Complaint Abdominal Pain     HPI Taylor Mercer is a 33 y.o. female who presents with complaints of left-sided abdominal pain.  Patient reports that approximately 430 this morning she developed left flank/left lower quadrant abdominal pain.  She does report she has a history of kidney stones but typically she has pain in her back from them.  She denies dysuria.  Does report hematuria.  Denies fevers or chills.  Has not take anything for this.  Describes the pain is sharp and radiating to her groin   Past Medical History:  Diagnosis Date  . Endometriosis   . Hypertension   . Kidney stones   . Kidney stones   . Migraine     There are no active problems to display for this patient.   Past Surgical History:  Procedure Laterality Date  . ABDOMINAL HYSTERECTOMY    . APPENDECTOMY    . CESAREAN SECTION      Prior to Admission medications   Medication Sig Start Date End Date Taking? Authorizing Provider  dicyclomine (BENTYL) 20 MG tablet Take 20 mg by mouth 3 (three) times daily. 07/11/18 07/11/19 Yes [provider]  omeprazole (PRILOSEC) 20 MG capsule Take 20 mg by mouth daily. 07/11/18 07/11/19 Yes [provider]  tiZANidine (ZANAFLEX) 4 MG tablet Take 4 mg by mouth 3 (three) times daily. 06/14/18 06/14/19 Yes [provider]  amLODipine (NORVASC) 10 MG tablet Take 10 mg by mouth daily.    [provider]  amLODipine (NORVASC) 5 MG tablet Take 5 mg by mouth daily.    [provider]  azelastine (ASTELIN) 0.1 % nasal spray Place 1 spray into both nostrils 2 (two) times daily. 12/11/15   [provider]  cetirizine (ZYRTEC) 10 MG tablet Take 1 tablet (10 mg total) by mouth daily. 07/09/17   Luvenia Redden, PA-C   chlorthalidone (HYGROTON) 25 MG tablet Take 1 tablet (25 mg total) by mouth daily. 07/16/15   Schuyler Amor, MD  famotidine (PEPCID) 20 MG tablet Take 1 tablet (20 mg total) by mouth 2 (two) times daily. 04/26/18   Carrie Mew, MD  fluticasone [facility]) 50 MCG/ACT nasal spray Place 1 spray into both nostrils daily. 07/09/17   Luvenia Redden, PA-C  HYDROcodone-acetaminophen (NORCO/VICODIN) 5-325 MG tablet Take 1 tablet by mouth every 6 (six) hours as needed for severe pain. 03/01/19   Lavonia Drafts, MD  metoCLOPramide (REGLAN) 10 MG tablet Take 1 tablet (10 mg total) by mouth every 6 (six) hours as needed. 04/26/18   Carrie Mew, MD  naproxen (NAPROSYN) 500 MG tablet Take 1 tablet (500 mg total) by mouth 2 (two) times daily with a meal. 03/01/19   Lavonia Drafts, MD  ondansetron (ZOFRAN ODT) 4 MG disintegrating tablet Take 1 tablet (4 mg total) by mouth every 8 (eight) hours as needed. 03/01/19   Lavonia Drafts, MD  propranolol (INDERAL) 10 MG tablet Take 10 mg by mouth 2 (two) times a day. 02/18/19   [provider]  SUMAtriptan (IMITREX) 50 MG tablet Take 50 mg by mouth. Take 1 tablet (50mg ) by mouth every two hours as needed for migraine. 02/03/16   [provider]  tamsulosin (FLOMAX) 0.4 MG CAPS capsule Take 1 capsule (0.4 mg total) by mouth daily. 03/01/19   Lavonia Drafts,  MD     Allergies Patient has no known allergies.  Family History  Problem Relation Age of Onset  . Hypertension Mother   . Cancer Father     Social History Social History   Tobacco Use  . Smoking status: Never Smoker  . Smokeless tobacco: Never Used  Substance Use Topics  . Alcohol use: No  . Drug use: No    Review of Systems  Constitutional: No fever/chills Eyes: No visual changes.  ENT: No sore throat. Cardiovascular: Denies chest pain. Respiratory: Denies shortness of breath. Gastrointestinal: As above Genitourinary: Negative for dysuria.  Hematuria as above  Musculoskeletal: Negative for back pain. Skin: Negative for rash. Neurological: Negative for headaches   ____________________________________________   PHYSICAL EXAM:  VITAL SIGNS: ED Triage Vitals  Enc Vitals Group     BP 03/01/19 0630 (!) 189/98     Pulse Rate 03/01/19 0630 79     Resp 03/01/19 0630 20     Temp 03/01/19 0630 98 F (36.7 C)     Temp Source 03/01/19 0630 Oral     SpO2 03/01/19 0630 99 %     Weight 03/01/19 0630 104.3 kg (230 lb)     Height 03/01/19 0630 1.626 m (5\' 4" )     Head Circumference --      Peak Flow --      Pain Score 03/01/19 0629 9     Pain Loc --      Pain Edu? --      Excl. in GC? --     Constitutional: Alert and oriented.    Nose: No congestion/rhinnorhea. Mouth/Throat: Mucous membranes are moist.    Cardiovascular: Normal rate, regular rhythm. Grossly normal heart sounds.   Respiratory: Normal respiratory effort.  No retractions.  Gastrointestinal: Soft and nontender. No distention.  No CVA tenderness.  Musculoskeletal: No lower extremity tenderness nor edema.  Warm and well perfused Neurologic:  Normal speech and language. No gross focal neurologic deficits are appreciated.  Skin:  Skin is warm, dry and intact. No rash noted. Psychiatric: Mood and affect are normal. Speech and behavior are normal.  ____________________________________________   LABS (all labs ordered are listed, but only abnormal results are displayed)  Labs Reviewed  COMPREHENSIVE METABOLIC PANEL - Abnormal; Notable for the following components:      Result Value   Glucose, Bld 145 (*)    All other components within normal limits  URINALYSIS, COMPLETE (UACMP) WITH MICROSCOPIC - Abnormal; Notable for the following components:   Color, Urine YELLOW (*)    APPearance CLOUDY (*)    Hgb urine dipstick LARGE (*)    Protein, ur 100 (*)    RBC / HPF >50 (*)    All other components within normal limits  CBC WITH DIFFERENTIAL/PLATELET  LIPASE, BLOOD    ____________________________________________  EKG   ____________________________________________  RADIOLOGY  KUB ____________________________________________   PROCEDURES  Procedure(s) performed: No  Procedures   Critical Care performed: No ____________________________________________   INITIAL IMPRESSION / ASSESSMENT AND PLAN / ED COURSE  Pertinent labs & imaging results that were available during my care of the patient were reviewed by me and considered in my medical decision making (see chart for details).  Patient presents with left-sided flank pain, hematuria strongly suspicious for kidney stones given her history.  We will treat with IV Toradol given her history of hysterectomy, IV Zofran, IV fluids, obtain KUB and reevaluate.  Urinalysis demonstrates hemoglobin but no suspicion for infection lab work otherwise reassuring  Patient required dose of IV morphine  After additional pain medication patient reports her pain had resolved.  CT confirmed 7 mm distal ureteral stone, will refer to Dr. Gabrielle DareSninski as an outpatient, he states he can see her early next week, discussed with patient the need to return if pain uncontrolled development of fever or dysuria.    ____________________________________________   FINAL CLINICAL IMPRESSION(S) / ED DIAGNOSES  Final diagnoses:  Ureterolithiasis        Note:  This document was prepared using Dragon voice recognition software and may include unintentional dictation errors.   Jene EveryKinner, Mustaf Antonacci, MD 03/01/19 1143

## 2019-03-01 NOTE — ED Notes (Signed)
Pt notified to call her ride.

## 2019-03-06 ENCOUNTER — Other Ambulatory Visit
Admission: RE | Admit: 2019-03-06 | Discharge: 2019-03-06 | Disposition: A | Discharge status: Home / Self Care | Payer: BC Managed Care – PPO | Source: Ambulatory Visit | Attending: Urology | Admitting: Urology

## 2019-03-06 ENCOUNTER — Encounter: Payer: Self-pay | Admitting: Urology

## 2019-03-06 ENCOUNTER — Ambulatory Visit (INDEPENDENT_AMBULATORY_CARE_PROVIDER_SITE_OTHER): Payer: BC Managed Care – PPO | Admitting: Urology

## 2019-03-06 ENCOUNTER — Telehealth: Payer: Self-pay | Admitting: Radiology

## 2019-03-06 ENCOUNTER — Encounter: Payer: Self-pay | Admitting: Radiology

## 2019-03-06 ENCOUNTER — Ambulatory Visit
Admission: RE | Admit: 2019-03-06 | Discharge: 2019-03-06 | Disposition: A | Discharge status: Home / Self Care | Payer: BC Managed Care – PPO | Source: Ambulatory Visit | Attending: Urology | Admitting: Urology

## 2019-03-06 ENCOUNTER — Other Ambulatory Visit: Payer: Self-pay

## 2019-03-06 VITALS — BP 138/84 | HR 87 | Ht 64.0 in | Wt 230.0 lb

## 2019-03-06 DIAGNOSIS — N201 Calculus of ureter: Secondary | ICD-10-CM

## 2019-03-06 DIAGNOSIS — K219 Gastro-esophageal reflux disease without esophagitis: Secondary | ICD-10-CM | POA: Diagnosis not present

## 2019-03-06 DIAGNOSIS — Z6839 Body mass index (BMI) 39.0-39.9, adult: Secondary | ICD-10-CM | POA: Diagnosis not present

## 2019-03-06 DIAGNOSIS — Z1159 Encounter for screening for other viral diseases: Secondary | ICD-10-CM | POA: Diagnosis not present

## 2019-03-06 DIAGNOSIS — D649 Anemia, unspecified: Secondary | ICD-10-CM | POA: Diagnosis not present

## 2019-03-06 DIAGNOSIS — I10 Essential (primary) hypertension: Secondary | ICD-10-CM | POA: Diagnosis not present

## 2019-03-06 DIAGNOSIS — G43909 Migraine, unspecified, not intractable, without status migrainosus: Secondary | ICD-10-CM | POA: Diagnosis not present

## 2019-03-06 LAB — URINALYSIS, COMPLETE
Bilirubin, UA: NEGATIVE
Glucose, UA: NEGATIVE
Ketones, UA: NEGATIVE
Leukocytes,UA: NEGATIVE
Nitrite, UA: NEGATIVE
Protein,UA: NEGATIVE
Specific Gravity, UA: 1.015 (ref 1.005–1.030)
Urobilinogen, Ur: 0.2 mg/dL (ref 0.2–1.0)
pH, UA: 5 (ref 5.0–7.5)

## 2019-03-06 LAB — MICROSCOPIC EXAMINATION: Epithelial Cells (non renal): >10 /HPF — AB (ref 0–10)

## 2019-03-06 LAB — SARS CORONAVIRUS 2 BY RT PCR (HOSPITAL ORDER, PERFORMED IN ~~LOC~~ HOSPITAL LAB): SARS Coronavirus 2: NEGATIVE

## 2019-03-06 NOTE — Patient Instructions (Signed)
Ureteral Stent Implantation  Ureteral stent implantation is a procedure to insert (implant) a flexible, soft, plastic tube (stent) into a tube (ureter) that drains urine from the kidneys. The stent supports the ureter while it heals and helps to drain urine from the kidneys. You may have a ureteral stent implanted after having a procedure to remove a blockage from the ureter (ureterolysis or pyeloplasty).You may also have a stent implanted to open the flow of urine when you have a blockage caused by a kidney stone, tumor, blood clot, or infection. Ureteroscopy Ureteroscopy is a procedure to check for and treat problems inside part of the urinary tract. In this procedure, a thin, tube-shaped instrument with a light at the end (ureteroscope) is used to look at the inside of the kidneys and the ureters, which are the tubes that carry urine from the kidneys to the bladder. The ureteroscope is inserted into one or both of the ureters. You may need this procedure if you have frequent urinary tract infections (UTIs), blood in your urine, or a stone in one of your ureters. A ureteroscopy can be done to find the cause of urine blockage in a ureter and to evaluate other abnormalities inside the ureters or kidneys. If stones are found, they can be removed during the procedure. Polyps, abnormal tissue, and some types of tumors can also be removed or treated. The ureteroscope may also have a tool to remove tissue to be checked for disease under a microscope (biopsy). Tell a health care provider about:  Any allergies you have.  All medicines you are taking, including vitamins, herbs, eye drops, creams, and over-the-counter medicines.  Any problems you or family members have had with anesthetic medicines.  Any blood disorders you have.  Any surgeries you have had.  Any medical conditions you have.  Whether you are pregnant or may be pregnant. What are the risks? Generally, this is a safe procedure. However,  problems may occur, including:  Bleeding.  Infection.  Allergic reactions to medicines.  Scarring that narrows the ureter (stricture).  Creating a hole in the ureter (perforation). What happens before the procedure? Staying hydrated Follow instructions from your health care provider about hydration, which may include:  Up to 2 hours before the procedure - you may continue to drink clear liquids, such as water, clear fruit juice, black coffee, and plain tea. Eating and drinking restrictions Follow instructions from your health care provider about eating and drinking, which may include:  8 hours before the procedure - stop eating heavy meals or foods such as meat, fried foods, or fatty foods.  6 hours before the procedure - stop eating light meals or foods, such as toast or cereal.  6 hours before the procedure - stop drinking milk or drinks that contain milk.  2 hours before the procedure - stop drinking clear liquids. Medicines  Ask your health care provider about: ? Changing or stopping your regular medicines. This is especially important if you are taking diabetes medicines or blood thinners. ? Taking medicines such as aspirin and ibuprofen. These medicines can thin your blood. Do not take these medicines before your procedure if your health care provider instructs you not to.  You may be given antibiotic medicine to help prevent infection. General instructions  You may have a urine sample taken to check for infection.  Plan to have someone take you home from the hospital or clinic. What happens during the procedure?   To reduce your risk of infection: ?  Your health care team will wash or sanitize their hands. ? Your skin will be washed with soap.  An IV tube will be inserted into one of your veins.  You will be given one of the following: ? A medicine to help you relax (sedative). ? A medicine to make you fall asleep (general anesthetic). ? A medicine that is  injected into your spine to numb the area below and slightly above the injection site (spinal anesthetic).  To lower your risk of infection, you may be given an antibiotic medicine by an injection or through the IV tube.  The opening from which you urinate (urethra) will be cleaned with a germ-killing solution.  The ureteroscope will be passed through your urethra into your bladder.  A salt-water solution will flow through the ureteroscope to fill your bladder. This will help the health care provider see the openings of your ureters more clearly.  Then, the ureteroscope will be passed into your ureter. ? If a growth is found, a piece of it may be removed so it can be examined under a microscope (biopsy). ? If a stone is found, it may be removed through the ureteroscope, or the stone may be broken up using a laser, shock waves, or electrical energy. ? In some cases, if the ureter is too small, a tube may be inserted that keeps the ureter open (ureteral stent). The stent may be left in place for 1 or 2 weeks to keep the ureter open, and then the ureteroscopy procedure will be performed.  The scope will be removed, and your bladder will be emptied. The procedure may vary among health care providers and hospitals. What happens after the procedure?  Your blood pressure, heart rate, breathing rate, and blood oxygen level will be monitored until the medicines you were given have worn off.  You may be asked to urinate.  Donot drive for 24 hours if you were given a sedative. This information is not intended to replace advice given to you by your health care provider. Make sure you discuss any questions you have with your health care provider. Document Released: 09/04/2013 Document Revised: 06/15/2016 Document Reviewed: 06/11/2016 Elsevier Interactive Patient Education  2019 [place] have two ureters, one on each side of the body. The ureters connect the kidneys to the organ that holds  urine until it passes out of the body (bladder). The stent is placed so that one end is in the kidney, and one end is in the bladder. The stent is usually taken out after your ureter has healed. Depending on your condition, you may have a stent for just a few weeks, or you may have a long-term stent that will need to be replaced every few months. Tell a health care provider about:  Any allergies you have.  All medicines you are taking, including vitamins, herbs, eye drops, creams, and over-the-counter medicines.  Any problems you or family members have had with anesthetic medicines.  Any blood disorders you have.  Any surgeries you have had.  Any medical conditions you have.  Whether you are pregnant or may be pregnant. What are the risks? Generally, this is a safe procedure. However, problems may occur, including:  Infection.  Bleeding.  Allergic reactions to medicines.  Damage to other structures or organs. Tearing (perforation) of the ureter is possible.  Movement of the stent away from where it is placed during surgery (migration). What happens before the procedure?  Ask your health care provider  about: ? Changing or stopping your regular medicines. This is especially important if you are taking diabetes medicines or blood thinners. ? Taking medicines such as aspirin and ibuprofen. These medicines can thin your blood. Do not take these medicines before your procedure if your health care provider instructs you not to.  Follow instructions from your health care provider about eating or drinking restrictions.  Do not drink alcohol and do not use any tobacco products before your procedure, as told by your health care provider.  You may be given antibiotic medicine to help prevent infection.  Plan to have someone take you home after the procedure.  If you go home right after the procedure, plan to have someone with you for 24 hours. What happens during the procedure?  An  IV tube will be inserted into one of your veins.  You will be given a medicine to make you fall asleep (general anesthetic). You may also be given a medicine to help you relax (sedative).  A thin, tube-shaped instrument with a light and tiny camera at the end (cystoscope) will be inserted into your urethra. The urethra is the tube that drains urine from the bladder out of the body. In men, the urethra opens at the end of the penis. In women, the urethra opens in front of the vaginal opening.  The cystoscope will be passed into your bladder.  A thin wire (guide wire) will be passed through your bladder and into your ureter. This is used to guide the stent into your ureter.  The stent will be inserted into your ureter.  The guide wire and the cystoscope will be removed.  A flexible tube (catheter) will be inserted through your urethra so that one end is in your bladder. This helps to drain urine from your bladder. The procedure may vary among hospitals and health care providers. What happens after the procedure?  Your blood pressure, heart rate, breathing rate, and blood oxygen level will be monitored often until the medicines you were given have worn off.  You may continue to receive medicine and fluids through an IV tube.  You may have some soreness or pain in your abdomen and urethra. Medicines will be available to help you.  You will be encouraged to get up and walk around as soon as you can.  You will have a catheter draining your urine.  You will have some blood in your urine.  Do not drive for 24 hours if you received a sedative. This information is not intended to replace advice given to you by your health care provider. Make sure you discuss any questions you have with your health care provider. Document Released: 08/27/2000 Document Revised: 02/05/2016 Document Reviewed: 03/14/2015 Elsevier Interactive Patient Education  2019 [employer]

## 2019-03-06 NOTE — Telephone Encounter (Signed)
Patient was given the [facility] Information form below as well as the Instructions for Pre-Admission Testing form & a map of [facility].    [place], [address] [place], [place] [address] Telephone: [phone] Fax: [phone]   Thank you for choosing [facility] for your upcoming surgery!  We are always here to assist in your urological needs.  Please read the following information with specific details for your upcoming appointments related to your surgery. Please contact Sheryll Dymek at [phone] Option 3 with any questions.  The Name of Your Surgery: Left ureteroscopy, laser lithotripsy, stone removal, ureteral stent placement Your Surgery Date: 03/08/2019 Your Surgeon: Nickolas Madrid  Please call Same Day Surgery at [phone] between the hours of 1pm-3pm one day prior to your surgery. They will inform you of the time to arrive at Same Day Surgery which is located on the second floor of the [facility].   Please refer to the attached letter regarding instructions for Pre-Admission Testing. You will receive a call from the [place] office regarding your appointment with them.  The Pre-Admission Testing office is located at [place], on the first floor of the [place] [facility] in [place] (office is to the right as you enter through the [employer] of the [facility]). Please have all medications you are currently taking and your insurance card available.  A COVID-19 test will be required prior to surgery and once test is performed you will need to remain in quarantine until the day of surgery. Patient was advised to have nothing to eat or drink after midnight the night prior to surgery except that she may have only water until 2 hours before surgery with nothing to drink within 2 hours of surgery.  The patient states  she currently takes no blood thinners. Patient's questions were answered and she expressed understanding of these instructions.

## 2019-03-06 NOTE — Progress Notes (Signed)
03/06/2019 12:00 PM   Taylor Mercer 12-11-1985 [id number]  Referring provider: Elon Alas, MD [place],  [place] [address]  CC: Left groin and flank pain   HPI: I saw Taylor Mercer in urology clinic today for evaluation of left groin and flank pain.  She is a 33 year old female with a history of nephrolithiasis who originally presented to urgent care on 02/15/2019 with abdominal and left lower quadrant pain.  She was diagnosed with possible gas pain at that time.  Her pain continued to worsen, ultimately culminating in an emergency room visit on 03/01/2019.  CT renal stone study at that visit demonstrated a 7 mm left distal ureteral stone with upstream hydronephrosis.  Urinalysis was benign and she was discharged home with Flomax, pain control, and close follow-up with urology.  She denies any fevers or chills.  She continues to have intermittent left-sided groin and flank pain.  She has had intermittent nausea.  She denies vomiting.  There are no aggravating or alleviating factors.  Severity is moderate.  KUB today suggests minimally changed location of left distal ureteral stone, possibly with an additional stone that has dropped down above it.  Urinalysis is noninfected.   PMH: Past Medical History:  Diagnosis Date  . Endometriosis   . Hypertension   . Kidney stones   . Kidney stones   . Migraine     Surgical History: Past Surgical History:  Procedure Laterality Date  . ABDOMINAL HYSTERECTOMY    . APPENDECTOMY    . CESAREAN SECTION      Allergies: No Known Allergies  Family History: Family History  Problem Relation Age of Onset  . Hypertension Mother   . Cancer Father     Social History:  reports that she has never smoked. She has never used smokeless tobacco. She reports that she does not drink alcohol or use drugs.  ROS: Please see flowsheet from today's date for complete review of systems.  Physical Exam: BP 138/84   Pulse 87   Ht  5\' 4"  (1.626 m)   Wt 230 lb (104.3 kg)   BMI 39.48 kg/m    Constitutional:  Alert and oriented, No acute distress. Cardiovascular: RRR Respiratory: Clear to auscultation bilaterally GI: Abdomen is soft, nontender, nondistended, no abdominal masses GU: Left CVA tenderness Lymph: No cervical or inguinal lymphadenopathy. Skin: No rashes, bruises or suspicious lesions. Neurologic: Grossly intact, no focal deficits, moving all 4 extremities. Psychiatric: Normal mood and affect.  Laboratory Data: Coronavirus testing today negative Urinalysis today 0-5 WBCs, 3-10 RBCs, few bacteria, nitrite negative.  We will send for culture.  Pertinent Imaging: I have personally reviewed the CT from 03/01/2019.  There is a 7 mm stone in the left distal ureter with upstream hydronephrosis.  Nonobstructing right lower pole stone.  On KUB today, appears to be an additional left-sided stone in the distal ureter, and stable 7 mm distal ureteral stone.  Assessment & Plan:   In summary, the patient is a 33 year old female with ongoing left-sided groin pain and nausea secondary to a 7 mm left distal ureteral stone.  Urinalysis appears non-infected today and clinically she appears well.  We discussed various treatment options for urolithiasis including observation with or without medical expulsive therapy, shockwave lithotripsy (SWL), ureteroscopy and laser lithotripsy with stent placement, and percutaneous nephrolithotomy.  We discussed that management is based on stone size, location, density, patient co-morbidities, and patient preference.   Stones <50mm in size have a >80% spontaneous passage  rate. Data surrounding the use of tamsulosin for medical expulsive therapy is controversial, but meta analyses suggests it is most efficacious for distal stones between 5-3810mm in size. Possible side effects include dizziness/lightheadedness, and retrograde ejaculation.  SWL has a lower stone free rate in a single procedure,  but also a lower complication rate compared to ureteroscopy and avoids a stent and associated stent related symptoms. Possible complications include renal hematoma, steinstrasse, and need for additional treatment.  Ureteroscopy with laser lithotripsy and stent placement has a higher stone free rate than SWL in a single procedure, however increased complication rate including possible infection, ureteral injury, bleeding, and stent related morbidity. Common stent related symptoms include dysuria, urgency/frequency, and flank pain.  After an extensive discussion of the risks and benefits of the above treatment options, the patient would like to proceed with LEFT URETEROSCOPY, laser lithotripsy, stent placement.  -Schedule left ureteroscopy, laser lithotripsy, stent placement this week -Will discuss management of right renal stone in the future, may be candidate for right SWL.  She would like to avoid bilateral ureteral stents which is certainly reasonable.  Sondra ComeBrian C Mattison Stuckey, MD  [facility] [address], [address] [place], [place] [address] [phone]

## 2019-03-07 ENCOUNTER — Other Ambulatory Visit: Payer: Self-pay

## 2019-03-07 ENCOUNTER — Encounter
Admission: RE | Admit: 2019-03-07 | Discharge: 2019-03-07 | Disposition: A | Discharge status: Home / Self Care | Payer: BC Managed Care – PPO | Source: Ambulatory Visit | Attending: Urology | Admitting: Urology

## 2019-03-07 HISTORY — DX: Other complications of anesthesia, initial encounter: T88.59XA

## 2019-03-07 HISTORY — DX: Family history of other specified conditions: Z84.89

## 2019-03-07 HISTORY — DX: Cardiac murmur, unspecified: R01.1

## 2019-03-07 HISTORY — DX: Other specified postprocedural states: Z98.890

## 2019-03-07 HISTORY — DX: Anemia, unspecified: D64.9

## 2019-03-07 HISTORY — DX: Gastro-esophageal reflux disease without esophagitis: K21.9

## 2019-03-07 HISTORY — DX: Other specified postprocedural states: R11.2

## 2019-03-07 MED ORDER — CIPROFLOXACIN IN D5W 400 MG/200ML IV SOLN
400.0000 mg | INTRAVENOUS | Status: AC
  Administered 2019-03-08: 400 mg via INTRAVENOUS

## 2019-03-07 NOTE — Pre-Procedure Instructions (Signed)
ECG 12 lead (Adult)07/08/2018 [facility] Health Care Component Name Value Ref Range  EKG Systolic BP  mmHg  EKG Diastolic BP  mmHg  EKG Ventricular Rate 59 BPM  EKG Atrial Rate 59 BPM  EKG P-R Interval 148 ms  EKG QRS Duration 76 ms  EKG Q-T Interval 420 ms  EKG QTC Calculation 415 ms  EKG Calculated P Axis 28 degrees  EKG Calculated R Axis 13 degrees  EKG Calculated T Axis 38 degrees  QTC Fredericia 417 ms  Result Narrative  SINUS BRADYCARDIA OTHERWISE NORMAL ECG WHEN COMPARED WITH ECG OF 24-Jun-2013 21:31, NONSPECIFIC T WAVE ABNORMALITY NO LONGER EVIDENT IN ANTERIOR LEADS Confirmed by Stephani Police [phone]) on 07/08/2018 9:26:46 PM  Other Result Information  Interface, Rad Results In - 07/08/2018  9:26 PM EDT SINUS BRADYCARDIA OTHERWISE NORMAL ECG WHEN COMPARED WITH ECG OF 24-Jun-2013 21:31, NONSPECIFIC T WAVE ABNORMALITY NO LONGER EVIDENT IN ANTERIOR LEADS Confirmed by Stephani Police [phone]) on 07/08/2018 9:26:46 PM  Status Results Details

## 2019-03-07 NOTE — Patient Instructions (Signed)
Your procedure is scheduled on: 03-08-19  Report to Same Day Surgery 2nd floor medical mall [facility](Medical Mall Entrance-take elevator on left to 2nd floor.  Check in with surgery information desk.) To find out your arrival time please call [phone] between 1PM - 3PM on 03-07-19   Remember: Instructions that are not followed completely may result in serious medical risk, up to and including death, or upon the discretion of your surgeon and anesthesiologist your surgery may need to be rescheduled.    _x___ 1. Do not eat food after midnight the night before your procedure. You may drink clear liquids up to 2 hours before you are scheduled to arrive at the hospital for your procedure.  Do not drink clear liquids within 2 hours of your scheduled arrival to the hospital.  Clear liquids include  --Water or Apple juice without pulp  --Clear carbohydrate beverage such as ClearFast or Gatorade  --Black Coffee or Clear Tea (No milk, no creamers, do not add anything to the coffee or Tea   ____Ensure clear carbohydrate drink on the way to the hospital for bariatric patients  ____Ensure clear carbohydrate drink 3 hours before surgery for Dr Rutherford NailByrnett's patients if physician instructed.   No gum chewing or hard candies.     __x__ 2. No Alcohol for 24 hours before or after surgery.   __x__3. No Smoking or e-cigarettes for 24 prior to surgery.  Do not use any chewable tobacco products for at least 6 hour prior to surgery   ____  4. Bring all medications with you on the day of surgery if instructed.    __x__ 5. Notify your doctor if there is any change in your medical condition     (cold, fever, infections).    x___6. On the morning of surgery brush your teeth with toothpaste and water.  You may rinse your mouth with mouth wash if you wish.  Do not swallow any toothpaste or mouthwash.   Do not wear jewelry, make-up, hairpins, clips or nail polish.  Do not wear lotions, powders, or perfumes. You may wear  deodorant.  Do not shave 48 hours prior to surgery. Men may shave face and neck.  Do not bring valuables to the hospital.    [facility] is not responsible for any belongings or valuables.               Contacts, dentures or bridgework may not be worn into surgery.  Leave your suitcase in the car. After surgery it may be brought to your room.  For patients admitted to the hospital, discharge time is determined by your treatment team.  _  Patients discharged the day of surgery will not be allowed to drive home.  You will need someone to drive you home and stay with you the night of your procedure.    Please read over the following fact sheets that you were given:   [facility] Preparing [facility]   _x___ TAKE THE FOLLOWING MEDICATION THE MORNING OF SURGERY WITH A SMALL SIP OF WATER. These include:  1. PROPRANOLOL  2. NEXIUM   3. TAKE A NEXIUM TONIGHT BEFORE BED  4. YOU MAY TAKE A HYDROCODONE AM OF SURGERY IF NEEDED  5.  6.  ____Fleets enema or Magnesium Citrate as directed.   ____ Use CHG Soap or sage wipes as directed on instruction sheet   ____ Use inhalers on the day of surgery and bring to hospital day of surgery  ____ Stop Metformin and  Janumet 2 days prior to surgery.    ____ Take 1/2 of usual insulin dose the night before surgery and none on the morning surgery.   ____ Follow recommendations from Cardiologist, Pulmonologist or PCP regarding stopping Aspirin, Coumadin, Plavix ,Eliquis, Effient, or Pradaxa, and Pletal.  X____Stop Anti-inflammatories such as Advil, Aleve, Ibuprofen, Motrin, Naproxen, Naprosyn, Goodies powders or aspirin products NOW- OK to take Tylenol OR HYDROCODONE IF NEEDED    ____ Stop supplements until after surgery.     ____ Bring C-Pap to the hospital.

## 2019-03-08 ENCOUNTER — Encounter
Admission: RE | Disposition: A | Discharge status: Home / Self Care | Payer: Self-pay | Source: Home / Self Care | Attending: Urology

## 2019-03-08 ENCOUNTER — Ambulatory Visit: Payer: BC Managed Care – PPO

## 2019-03-08 ENCOUNTER — Ambulatory Visit
Admission: RE | Admit: 2019-03-08 | Discharge: 2019-03-08 | Disposition: A | Discharge status: Home / Self Care | Payer: BC Managed Care – PPO | Attending: Urology | Admitting: Urology

## 2019-03-08 ENCOUNTER — Ambulatory Visit: Payer: BC Managed Care – PPO | Admitting: Anesthesiology

## 2019-03-08 ENCOUNTER — Other Ambulatory Visit: Payer: Self-pay

## 2019-03-08 ENCOUNTER — Encounter: Payer: Self-pay | Admitting: *Deleted

## 2019-03-08 DIAGNOSIS — D649 Anemia, unspecified: Secondary | ICD-10-CM | POA: Insufficient documentation

## 2019-03-08 DIAGNOSIS — Z1159 Encounter for screening for other viral diseases: Secondary | ICD-10-CM | POA: Insufficient documentation

## 2019-03-08 DIAGNOSIS — K219 Gastro-esophageal reflux disease without esophagitis: Secondary | ICD-10-CM | POA: Insufficient documentation

## 2019-03-08 DIAGNOSIS — G43909 Migraine, unspecified, not intractable, without status migrainosus: Secondary | ICD-10-CM | POA: Insufficient documentation

## 2019-03-08 DIAGNOSIS — Z6839 Body mass index (BMI) 39.0-39.9, adult: Secondary | ICD-10-CM | POA: Insufficient documentation

## 2019-03-08 DIAGNOSIS — N201 Calculus of ureter: Secondary | ICD-10-CM

## 2019-03-08 DIAGNOSIS — I10 Essential (primary) hypertension: Secondary | ICD-10-CM | POA: Insufficient documentation

## 2019-03-08 HISTORY — PX: CYSTOSCOPY/URETEROSCOPY/HOLMIUM LASER/STENT PLACEMENT: SHX6546

## 2019-03-08 LAB — CULTURE, URINE COMPREHENSIVE

## 2019-03-08 SURGERY — CYSTOSCOPY/URETEROSCOPY/HOLMIUM LASER/STENT PLACEMENT
Anesthesia: General | Laterality: Left

## 2019-03-08 MED ORDER — ROCURONIUM BROMIDE 100 MG/10ML IV SOLN
INTRAVENOUS | Status: DC | PRN
  Administered 2019-03-08: 20 mg via INTRAVENOUS
  Administered 2019-03-08: 40 mg via INTRAVENOUS
  Administered 2019-03-08: 10 mg via INTRAVENOUS

## 2019-03-08 MED ORDER — PROPOFOL 10 MG/ML IV BOLUS
INTRAVENOUS | Status: DC | PRN
  Administered 2019-03-08: 150 mg via INTRAVENOUS

## 2019-03-08 MED ORDER — HYDROCODONE-ACETAMINOPHEN 5-325 MG PO TABS: 1.0000 | ORAL_TABLET | ORAL | 0 refills | Status: AC | PRN

## 2019-03-08 MED ORDER — PROPOFOL 500 MG/50ML IV EMUL
INTRAVENOUS | Status: AC
  Filled 2019-03-08: qty 50

## 2019-03-08 MED ORDER — FENTANYL CITRATE (PF) 100 MCG/2ML IJ SOLN
INTRAMUSCULAR | Status: AC
  Filled 2019-03-08: qty 2

## 2019-03-08 MED ORDER — DEXAMETHASONE SODIUM PHOSPHATE 10 MG/ML IJ SOLN
INTRAMUSCULAR | Status: DC | PRN
  Administered 2019-03-08: 10 mg via INTRAVENOUS

## 2019-03-08 MED ORDER — SUGAMMADEX SODIUM 200 MG/2ML IV SOLN
INTRAVENOUS | Status: DC | PRN
  Administered 2019-03-08: 208.6 mg via INTRAVENOUS

## 2019-03-08 MED ORDER — PROMETHAZINE HCL 25 MG/ML IJ SOLN: 6.2500 mg | INTRAMUSCULAR | Status: DC | PRN

## 2019-03-08 MED ORDER — PROPOFOL 500 MG/50ML IV EMUL
INTRAVENOUS | Status: DC | PRN
  Administered 2019-03-08: 150 ug/kg/min via INTRAVENOUS

## 2019-03-08 MED ORDER — IOPAMIDOL (ISOVUE-M 200) INJECTION 41%
INTRAMUSCULAR | Status: DC | PRN
  Administered 2019-03-08: 10 mL

## 2019-03-08 MED ORDER — SUCCINYLCHOLINE CHLORIDE 20 MG/ML IJ SOLN
INTRAMUSCULAR | Status: DC | PRN
  Administered 2019-03-08: 120 mg via INTRAVENOUS

## 2019-03-08 MED ORDER — PHENYLEPHRINE HCL (PRESSORS) 10 MG/ML IV SOLN
INTRAVENOUS | Status: DC | PRN
  Administered 2019-03-08 (×2): 100 ug via INTRAVENOUS

## 2019-03-08 MED ORDER — HYDROCODONE-ACETAMINOPHEN 5-325 MG PO TABS
1.0000 | ORAL_TABLET | Freq: Four times a day (QID) | ORAL | Status: DC | PRN
  Administered 2019-03-08: 12:00:00 1 via ORAL

## 2019-03-08 MED ORDER — SULFAMETHOXAZOLE-TRIMETHOPRIM 400-80 MG PO TABS: 1.0000 | ORAL_TABLET | Freq: Every day | ORAL | 0 refills | Status: DC

## 2019-03-08 MED ORDER — MIDAZOLAM HCL 2 MG/2ML IJ SOLN
INTRAMUSCULAR | Status: AC
  Filled 2019-03-08: qty 2

## 2019-03-08 MED ORDER — LACTATED RINGERS IV SOLN
INTRAVENOUS | Status: DC
  Administered 2019-03-08: 11:00:00 via INTRAVENOUS

## 2019-03-08 MED ORDER — FENTANYL CITRATE (PF) 100 MCG/2ML IJ SOLN
25.0000 ug | INTRAMUSCULAR | Status: DC | PRN
  Administered 2019-03-08: 25 ug via INTRAVENOUS
  Administered 2019-03-08: 50 ug via INTRAVENOUS

## 2019-03-08 MED ORDER — ONDANSETRON HCL 4 MG/2ML IJ SOLN
INTRAMUSCULAR | Status: DC | PRN
  Administered 2019-03-08: 4 mg via INTRAVENOUS

## 2019-03-08 MED ORDER — HYDROCODONE-ACETAMINOPHEN 5-325 MG PO TABS
ORAL_TABLET | ORAL | Status: AC
  Filled 2019-03-08: qty 1

## 2019-03-08 MED ORDER — LIDOCAINE HCL (CARDIAC) PF 100 MG/5ML IV SOSY
PREFILLED_SYRINGE | INTRAVENOUS | Status: DC | PRN
  Administered 2019-03-08: 100 mg via INTRAVENOUS

## 2019-03-08 MED ORDER — FENTANYL CITRATE (PF) 100 MCG/2ML IJ SOLN
INTRAMUSCULAR | Status: DC | PRN
  Administered 2019-03-08 (×2): 50 ug via INTRAVENOUS

## 2019-03-08 MED ORDER — OXYBUTYNIN CHLORIDE ER 10 MG PO TB24: 10.0000 mg | ORAL_TABLET | Freq: Every day | ORAL | 0 refills | Status: AC | PRN

## 2019-03-08 MED ORDER — OXYBUTYNIN CHLORIDE 5 MG PO TABS
5.0000 mg | ORAL_TABLET | Freq: Once | ORAL | Status: AC
  Administered 2019-03-08: 12:00:00 5 mg via ORAL

## 2019-03-08 MED ORDER — OXYBUTYNIN CHLORIDE 5 MG PO TABS
ORAL_TABLET | ORAL | Status: AC
  Filled 2019-03-08: qty 1

## 2019-03-08 SURGICAL SUPPLY — 31 items
BAG DRAIN CYSTO-URO LG1000N (MISCELLANEOUS) ×3 IMPLANT
BRUSH SCRUB EZ 1% IODOPHOR (MISCELLANEOUS) ×3 IMPLANT
BULB IRRIG PATHFIND (MISCELLANEOUS) IMPLANT
CATH URETL 5X70 OPEN END (CATHETERS) IMPLANT
CNTNR SPEC 2.5X3XGRAD LEK (MISCELLANEOUS)
CONT SPEC 4OZ STER OR WHT (MISCELLANEOUS)
CONTAINER SPEC 2.5X3XGRAD LEK (MISCELLANEOUS) IMPLANT
DRAPE UTILITY 15X26 TOWEL STRL (DRAPES) ×3 IMPLANT
FIBER LASER LITHO 273 (Laser) ×2 IMPLANT
GLOVE BIOGEL PI IND STRL 7.5 (GLOVE) ×1 IMPLANT
GLOVE BIOGEL PI INDICATOR 7.5 (GLOVE) ×2
GOWN STRL REUS W/ TWL LRG LVL3 (GOWN DISPOSABLE) ×1 IMPLANT
GOWN STRL REUS W/ TWL XL LVL3 (GOWN DISPOSABLE) ×1 IMPLANT
GOWN STRL REUS W/TWL LRG LVL3 (GOWN DISPOSABLE) ×2
GOWN STRL REUS W/TWL XL LVL3 (GOWN DISPOSABLE) ×2
GUIDEWIRE STR DUAL SENSOR (WIRE) ×3 IMPLANT
INFUSOR MANOMETER BAG 3000ML (MISCELLANEOUS) ×3 IMPLANT
INTRODUCER DILATOR DOUBLE (INTRODUCER) IMPLANT
KIT TURNOVER CYSTO (KITS) ×3 IMPLANT
PACK CYSTO AR (MISCELLANEOUS) ×3 IMPLANT
SET CYSTO W/LG BORE CLAMP LF (SET/KITS/TRAYS/PACK) ×3 IMPLANT
SHEATH URETERAL 12FRX35CM (MISCELLANEOUS) IMPLANT
SOL .9 NS 3000ML IRR  AL (IV SOLUTION) ×2
SOL .9 NS 3000ML IRR UROMATIC (IV SOLUTION) ×1 IMPLANT
STENT URET 6FRX24 CONTOUR (STENTS) IMPLANT
STENT URET 6FRX26 CONTOUR (STENTS) ×2 IMPLANT
SURGILUBE 2OZ TUBE FLIPTOP (MISCELLANEOUS) ×3 IMPLANT
SYR 10ML LL (SYRINGE) ×3 IMPLANT
TUBING ART PRESS 48 MALE/FEM (TUBING) IMPLANT
VALVE UROSEAL ADJ ENDO (VALVE) IMPLANT
WATER STERILE IRR 1000ML POUR (IV SOLUTION) ×3 IMPLANT

## 2019-03-08 NOTE — Anesthesia Postprocedure Evaluation (Signed)
Anesthesia Post Note  Patient: Taylor Mercer  Procedure(s) Performed: CYSTOSCOPY/URETEROSCOPY/HOLMIUM LASER/STENT PLACEMENT (Left )  Patient location during evaluation: PACU Anesthesia Type: General Level of consciousness: awake and alert Pain management: pain level controlled Vital Signs Assessment: post-procedure vital signs reviewed and stable Respiratory status: spontaneous breathing, nonlabored ventilation and respiratory function stable Cardiovascular status: blood pressure returned to baseline and stable Postop Assessment: no apparent nausea or vomiting Anesthetic complications: no     Last Vitals:  Vitals:   03/08/19 1216 03/08/19 1220  BP: 131/84   Pulse: 68   Resp: 15   Temp:  (!) 36.2 C  SpO2: 97%     Last Pain:  Vitals:   03/08/19 1216  TempSrc:   PainSc: [place]

## 2019-03-08 NOTE — Anesthesia Preprocedure Evaluation (Signed)
Anesthesia Evaluation  Patient identified by MRN, date of birth, ID band Patient awake    Reviewed: Allergy & Precautions, H&P , NPO status , Patient's Chart, lab work & pertinent test results, reviewed documented beta blocker date and time   History of Anesthesia Complications (+) PONV, Family history of anesthesia reaction and history of anesthetic complications  Airway Mallampati: III  TM Distance: >3 FB Neck ROM: full    Dental  (+) Missing, Dental Advidsory Given, Teeth Intact   Pulmonary neg pulmonary ROS,           Cardiovascular Exercise Tolerance: Good hypertension, (-) angina(-) Past MI (-) dysrhythmias + Valvular Problems/Murmurs (as a child)      Neuro/Psych  Headaches, neg Seizures negative psych ROS   GI/Hepatic Neg liver ROS, GERD  ,  Endo/Other  neg diabetesMorbid obesity  Renal/GU Renal disease (kidney stones)  negative genitourinary   Musculoskeletal   Abdominal   Peds  Hematology  (+) Blood dyscrasia, anemia ,   Anesthesia Other Findings Past Medical History: No date: Anemia     Comment:  WITH PREGNANCY No date: Complication of anesthesia No date: Endometriosis No date: Family history of adverse reaction to anesthesia     Comment:  DAD-N/V No date: GERD (gastroesophageal reflux disease)     Comment:  OCC No date: Heart murmur     Comment:  AS A CHILD-ASYMPTOMATIC No date: Hypertension No date: Kidney stones No date: Kidney stones No date: Migraine     Comment:  MIGRAINES No date: PONV (postoperative nausea and vomiting)   Reproductive/Obstetrics negative OB ROS                             Anesthesia Physical Anesthesia Plan  ASA: III  Anesthesia Plan: General   Post-op Pain Management:    Induction: Intravenous  PONV Risk Score and Plan: 4 or greater and Propofol infusion, TIVA, Ondansetron, Dexamethasone and Treatment may vary due to age or medical  condition  Airway Management Planned: LMA and Oral ETT  Additional Equipment:   Intra-op Plan:   Post-operative Plan: Extubation in OR  Informed Consent: I have reviewed the patients History and Physical, chart, labs and discussed the procedure including the risks, benefits and alternatives for the proposed anesthesia with the patient or authorized representative who has indicated his/her understanding and acceptance.     Dental Advisory Given  Plan Discussed with: Anesthesiologist, CRNA and Surgeon  Anesthesia Plan Comments:         Anesthesia Quick Evaluation

## 2019-03-08 NOTE — Transfer of Care (Signed)
Immediate Anesthesia Transfer of Care Note  Patient: Taylor Mercer  Procedure(s) Performed: CYSTOSCOPY/URETEROSCOPY/HOLMIUM LASER/STENT PLACEMENT (Left )  Patient Location: PACU  Anesthesia Type:General  Level of Consciousness: awake, alert  and oriented  Airway & Oxygen Therapy: Patient Spontanous Breathing and Patient connected to face mask oxygen  Post-op Assessment: Report given to RN and Post -op Vital signs reviewed and stable  Post vital signs: Reviewed and stable  Last Vitals:  Vitals Value Taken Time  BP    Temp    Pulse    Resp    SpO2      Last Pain:  Vitals:   03/08/19 0928  TempSrc: Oral  PainSc: 4          Complications: No apparent anesthesia complications

## 2019-03-08 NOTE — Anesthesia Procedure Notes (Signed)
Procedure Name: Intubation Date/Time: 03/08/2019 10:58 AM Performed by: Nelda Marseille, CRNA Pre-anesthesia Checklist: Patient identified, Patient being monitored, Timeout performed, Emergency Drugs available and Suction available Patient Re-evaluated:Patient Re-evaluated prior to induction Oxygen Delivery Method: Circle system utilized Preoxygenation: Pre-oxygenation with 100% oxygen Induction Type: IV induction Ventilation: Mask ventilation without difficulty Laryngoscope Size: Mac, 3 and McGraph Grade View: Grade III Tube type: Oral Tube size: 7.0 mm Number of attempts: 1 Airway Equipment and Method: Stylet Placement Confirmation: ETT inserted through vocal cords under direct vision,  positive ETCO2 and breath sounds checked- equal and bilateral Secured at: 21 cm Tube secured with: Tape Dental Injury: Teeth and Oropharynx as per pre-operative assessment

## 2019-03-08 NOTE — Anesthesia Post-op Follow-up Note (Signed)
Anesthesia QCDR form completed.        

## 2019-03-08 NOTE — H&P (Signed)
UROLOGY H&P UPDATE  Agree with prior H&P dated 03/06/19. Ongoing groin and flank pain from 51mm left distal ureteral stone for > 2 weeks. Urine culture no growth. She denies fevers or chills.  Will address right sided 33mm lower pole stone in follow-up, likely SWL. She wants to avoid bilateral ureteral stents.  Cardiac: RRR Lungs: CTA bilaterally  Laterality: LEFT Procedure: LEFT URETEROSCOPY, LASER LITHOTRIPSY, STENT PLACEMENT  Urine: CULTURE 6/23 25K mixed urogenital flora  Informed consent obtained, we specifically discussed the risks of bleeding, infection, post-operative pain, need for additional procedures, ureteral injury, and stent related morbidity.   Billey Co, MD 03/08/2019

## 2019-03-08 NOTE — Op Note (Signed)
Date of procedure: 03/08/19  Preoperative diagnosis:  1. Left distal ureteral stones (60mm and 68mm)  Postoperative diagnosis:  1. Same   Procedure: 1. Cystoscopy, left ureteroscopy, laser lithotripsy, left retrograde pyelogram with intraoperative interpretation, left ureteral stent placement  Surgeon: Nickolas Madrid, MD  Anesthesia: General  Complications: None  Intraoperative findings:  1. Normal cystoscopy 2. Uncomplicated dusting of 2 (48mm and 43mm) left distal ureteral stones 3. Uncomplicated left ureteral stent placement  EBL: None  Specimens: Stone for analysis  Drains: Left 63F x 26cm ureteral stent  Indication: Branna Layliana Devins is a 32 y.o. patient with ongoing left flank and groin pain for over 3 weeks with CT and KUB demonstrating 2 left distal ureteral stones.  After reviewing the management options for treatment, they elected to proceed with the above surgical procedure(s). We have discussed the potential benefits and risks of the procedure, side effects of the proposed treatment, the likelihood of the patient achieving the goals of the procedure, and any potential problems that might occur during the procedure or recuperation. Informed consent has been obtained.  Description of procedure:  The patient was taken to the operating room and general anesthesia was induced. SCDs were placed for DVT prophylaxis. The patient was placed in the dorsal lithotomy position, prepped and draped in the usual sterile fashion, and preoperative antibiotics(Cipro) were administered. A preoperative time-out was performed.   A 21 French rigid cystoscope was used to intubate the urethra.  Thorough cystoscopy was performed and was unremarkable.  The ureteral orifices were orthotopic bilaterally.  A sensor wire was used to intubate the left ureteral orifice and was advanced alongside the stones under fluoroscopic vision up to the renal pelvis.  A semirigid ureteroscope was then advanced  alongside the wire and a yellow stone was identified in the distal ureter.  This was fragmented to dust using the 273 m laser fiber on settings of 0.2 J and 40 Hz.  Behind the 78mm stone was another approximately 4 mm ureteral stone.  This was also fragmented to dust on the after mentioned settings.  All stone fragments were irrigated free from the ureter.  Contrast was injected through the rigid cystoscope to perform a retrograde pyelogram, and there was no extravasation of contrast or filling defects noted.  Pullback ureteroscopy demonstrated no ureteral injury or residual fragments.  A 6 French by 26 cm ureteral stent was placed under fluoroscopic vision with an excellent curl in the renal pelvis, as well as in the bladder.  Cystoscopy was performed and demonstrated an excellent curl in the bladder, and stone fragments were irrigated free to send for analysis.  The bladder was drained and this concluded our procedure.  Disposition: Stable to PACU  Plan: Follow-up in ~5 days for stent removal Discussed management options for right lower pole stone at follow-up  Nickolas Madrid, MD

## 2019-03-08 NOTE — Discharge Instructions (Signed)

## 2019-03-09 ENCOUNTER — Telehealth: Payer: Self-pay | Admitting: Family Medicine

## 2019-03-09 ENCOUNTER — Other Ambulatory Visit: Payer: Self-pay | Admitting: Urology

## 2019-03-09 MED ORDER — OXYCODONE HCL 5 MG PO TABS: 5.0000 mg | ORAL_TABLET | ORAL | 0 refills | Status: AC | PRN

## 2019-03-09 NOTE — Telephone Encounter (Signed)
Patient called stating she is in a lot of pain post surgery and wants to know if you can call her in anything different to help.

## 2019-03-09 NOTE — Telephone Encounter (Signed)
Patient notified Oxy was sent to pharmacy. She was encouraged to take NSAIDS and Oxybutynin and only use the OXY prn. Patient voiced understanding.

## 2019-03-13 ENCOUNTER — Ambulatory Visit (INDEPENDENT_AMBULATORY_CARE_PROVIDER_SITE_OTHER): Payer: BC Managed Care – PPO | Admitting: Urology

## 2019-03-13 ENCOUNTER — Other Ambulatory Visit: Payer: Self-pay

## 2019-03-13 ENCOUNTER — Encounter: Payer: Self-pay | Admitting: Urology

## 2019-03-13 VITALS — BP 115/75 | HR 80 | Ht 64.0 in | Wt 235.0 lb

## 2019-03-13 DIAGNOSIS — N200 Calculus of kidney: Secondary | ICD-10-CM

## 2019-03-13 LAB — URINALYSIS, COMPLETE
Bilirubin, UA: NEGATIVE
Glucose, UA: NEGATIVE
Ketones, UA: NEGATIVE
Nitrite, UA: NEGATIVE
Specific Gravity, UA: 1.025 (ref 1.005–1.030)
Urobilinogen, Ur: 0.2 mg/dL (ref 0.2–1.0)
pH, UA: 6 (ref 5.0–7.5)

## 2019-03-13 LAB — MICROSCOPIC EXAMINATION
Epithelial Cells (non renal): >10 /hpf — ABNORMAL HIGH (ref 0–10)
RBC: >30 /hpf — ABNORMAL HIGH (ref 0–2)

## 2019-03-13 NOTE — Progress Notes (Signed)
Cystoscopy Procedure Note:  Indication: Stent removal s/p LEFT URS for distal 45mm and 32mm ureteral stones  After informed consent and discussion of the procedure and its risks, Taylor Mercer was positioned and prepped in the standard fashion. Cystoscopy was performed with a flexible cystoscope. The stent was grasped with flexible graspers and removed in its entirety. The patient tolerated the procedure well.  Findings: Uncomplicated stent removal  Assessment and Plan: Follow up in 4 weeks with renal ultrasound to evaluate for silent hydronephrosis Discuss SWL for right 61mm lower pole stone at that visit, she did not tolerate her stent well  Billey Co, MD 03/13/2019

## 2019-03-13 NOTE — Patient Instructions (Signed)
Dietary Guidelines to Help Prevent Kidney Stones Kidney stones are deposits of minerals and salts that form inside your kidneys. Your risk of developing kidney stones may be greater depending on your diet, your lifestyle, the medicines you take, and whether you have certain medical conditions. Most people can reduce their chances of developing kidney stones by following the instructions below. Depending on your overall health and the type of kidney stones you tend to develop, your dietitian may give you more specific instructions. What are tips for following this plan? Reading food labels  Choose foods with "no salt added" or "low-salt" labels. Limit your sodium intake to less than 1500 mg per day.  Choose foods with calcium for each meal and snack. Try to eat about 300 mg of calcium at each meal. Foods that contain 200-500 mg of calcium per serving include: ? 8 oz (237 ml) of milk, fortified nondairy milk, and fortified fruit juice. ? 8 oz (237 ml) of kefir, yogurt, and soy yogurt. ? 4 oz (118 ml) of tofu. ? 1 oz of cheese. ? 1 cup (300 g) of dried figs. ? 1 cup (91 g) of cooked broccoli. ? 1-3 oz can of sardines or mackerel.  Most people need 1000 to 1500 mg of calcium each day. Talk to your dietitian about how much calcium is recommended for you. Shopping  Buy plenty of fresh fruits and vegetables. Most people do not need to avoid fruits and vegetables, even if they contain nutrients that may contribute to kidney stones.  When shopping for convenience foods, choose: ? Whole pieces of fruit. ? Premade salads with dressing on the side. ? Low-fat fruit and yogurt smoothies.  Avoid buying frozen meals or prepared deli foods.  Look for foods with live cultures, such as yogurt and kefir. Cooking  Do not add salt to food when cooking. Place a salt shaker on the table and allow each person to add his or her own salt to taste.  Use vegetable protein, such as beans, textured vegetable  protein (TVP), or tofu instead of meat in pasta, casseroles, and soups. Meal planning   Eat less salt, if told by your dietitian. To do this: ? Avoid eating processed or premade food. ? Avoid eating fast food.  Eat less animal protein, including cheese, meat, poultry, or fish, if told by your dietitian. To do this: ? Limit the number of times you have meat, poultry, fish, or cheese each week. Eat a diet free of meat at least 2 days a week. ? Eat only one serving each day of meat, poultry, fish, or seafood. ? When you prepare animal protein, cut pieces into small portion sizes. For most meat and fish, one serving is about the size of one deck of cards.  Eat at least 5 servings of fresh fruits and vegetables each day. To do this: ? Keep fruits and vegetables on hand for snacks. ? Eat 1 piece of fruit or a handful of berries with breakfast. ? Have a salad and fruit at lunch. ? Have two kinds of vegetables at dinner.  Limit foods that are high in a substance called oxalate. These include: ? Spinach. ? Rhubarb. ? Beets. ? Potato chips and french fries. ? Nuts.  If you regularly take a diuretic medicine, make sure to eat at least 1-2 fruits or vegetables high in potassium each day. These include: ? Avocado. ? Banana. ? Orange, prune, carrot, or tomato juice. ? Baked potato. ? Cabbage. ? Beans and split   peas. General instructions   Drink enough fluid to keep your urine clear or pale yellow. This is the most important thing you can do.  Talk to your health care provider and dietitian about taking daily supplements. Depending on your health and the cause of your kidney stones, you may be advised: ? Not to take supplements with vitamin C. ? To take a calcium supplement. ? To take a daily probiotic supplement. ? To take other supplements such as magnesium, fish oil, or vitamin B6.  Take all medicines and supplements as told by your health care provider.  Limit alcohol intake to no  more than 1 drink a day for nonpregnant women and 2 drinks a day for men. One drink equals 12 oz of beer, 5 oz of wine, or 1 oz of hard liquor.  Lose weight if told by your health care provider. Work with your dietitian to find strategies and an eating plan that works best for you. What foods are not recommended? Limit your intake of the following foods, or as told by your dietitian. Talk to your dietitian about specific foods you should avoid based on the type of kidney stones and your overall health. Grains Breads. Bagels. Rolls. Baked goods. Salted crackers. Cereal. Pasta. Vegetables Spinach. Rhubarb. Beets. Canned vegetables. Pickles. Olives. Meats and other protein foods Nuts. Nut butters. Large portions of meat, poultry, or fish. Salted or cured meats. Deli meats. Hot dogs. Sausages. Dairy Cheese. Beverages Regular soft drinks. Regular vegetable juice. Seasonings and other foods Seasoning blends with salt. Salad dressings. Canned soups. Soy sauce. Ketchup. Barbecue sauce. Canned pasta sauce. Casseroles. Pizza. Lasagna. Frozen meals. Potato chips. French fries. Summary  You can reduce your risk of kidney stones by making changes to your diet.  The most important thing you can do is drink enough fluid. You should drink enough fluid to keep your urine clear or pale yellow.  Ask your health care provider or dietitian how much protein from animal sources you should eat each day, and also how much salt and calcium you should have each day. This information is not intended to replace advice given to you by your health care provider. Make sure you discuss any questions you have with your health care provider. Document Released: 12/25/2010 Document Revised: 12/20/2018 Document Reviewed: 08/10/2016 [employer] Patient Education  2020 [employer]  

## 2019-03-14 ENCOUNTER — Telehealth: Payer: Self-pay

## 2019-03-14 MED ORDER — FLUCONAZOLE 150 MG PO TABS: 150.0000 mg | ORAL_TABLET | Freq: Once | ORAL | 0 refills | Status: AC

## 2019-03-14 NOTE — Telephone Encounter (Signed)
mychart note

## 2019-03-20 ENCOUNTER — Other Ambulatory Visit: Payer: BC Managed Care – PPO | Admitting: Urology

## 2019-03-21 LAB — CALCULI, WITH PHOTOGRAPH (CLINICAL LAB)
Calcium Oxalate Dihydrate: 20 %
Calcium Oxalate Monohydrate: 75 %
Hydroxyapatite: 5 %
Weight Calculi: 12 mg

## 2019-03-22 ENCOUNTER — Other Ambulatory Visit: Payer: Self-pay | Admitting: Urology

## 2019-03-22 MED ORDER — TAMSULOSIN HCL 0.4 MG PO CAPS: 0.4000 mg | ORAL_CAPSULE | Freq: Every day | ORAL | 0 refills | Status: DC

## 2019-04-19 ENCOUNTER — Other Ambulatory Visit: Payer: Self-pay

## 2019-04-19 ENCOUNTER — Ambulatory Visit
Admission: RE | Admit: 2019-04-19 | Discharge: 2019-04-19 | Disposition: A | Discharge status: Home / Self Care | Payer: BC Managed Care – PPO | Source: Ambulatory Visit | Attending: Urology | Admitting: Urology

## 2019-04-19 DIAGNOSIS — N200 Calculus of kidney: Secondary | ICD-10-CM

## 2019-04-26 ENCOUNTER — Encounter: Payer: Self-pay | Admitting: Urology

## 2019-04-26 ENCOUNTER — Other Ambulatory Visit: Payer: Self-pay

## 2019-04-26 ENCOUNTER — Ambulatory Visit (INDEPENDENT_AMBULATORY_CARE_PROVIDER_SITE_OTHER): Payer: BC Managed Care – PPO | Admitting: Urology

## 2019-04-26 VITALS — BP 153/94 | HR 88 | Ht 64.0 in | Wt 225.0 lb

## 2019-04-26 DIAGNOSIS — N200 Calculus of kidney: Secondary | ICD-10-CM

## 2019-04-26 NOTE — Patient Instructions (Signed)
Lithotripsy  Lithotripsy is a treatment that can sometimes help eliminate kidney stones and the pain that they cause. A form of lithotripsy, also known as extracorporeal shock wave lithotripsy, is a nonsurgical procedure that crushes a kidney stone with shock waves. These shock waves pass through your body and focus on the kidney stone. They cause the kidney stone to break up while it is still in the urinary tract. This makes it easier for the smaller pieces of stone to pass in the urine. Tell a health care provider about:  Any allergies you have.  All medicines you are taking, including vitamins, herbs, eye drops, creams, and over-the-counter medicines.  Any blood disorders you have.  Any surgeries you have had.  Any medical conditions you have.  Whether you are pregnant or may be pregnant.  Any problems you or family members have had with anesthetic medicines. What are the risks? Generally, this is a safe procedure. However, problems may occur, including:  Infection.  Bleeding of the kidney.  Bruising of the kidney or skin.  Scarring of the kidney, which can lead to: ? Increased blood pressure. ? Poor kidney function. ? Return (recurrence) of kidney stones.  Damage to other structures or organs, such as the liver, colon, spleen, or pancreas.  Blockage (obstruction) of the the tube that carries urine from the kidney to the bladder (ureter).  Failure of the kidney stone to break into pieces (fragments). What happens before the procedure? Staying hydrated Follow instructions from your health care provider about hydration, which may include:  Up to 2 hours before the procedure - you may continue to drink clear liquids, such as water, clear fruit juice, black coffee, and plain tea. Eating and drinking restrictions Follow instructions from your health care provider about eating and drinking, which may include:  8 hours before the procedure - stop eating heavy meals or foods  such as meat, fried foods, or fatty foods.  6 hours before the procedure - stop eating light meals or foods, such as toast or cereal.  6 hours before the procedure - stop drinking milk or drinks that contain milk.  2 hours before the procedure - stop drinking clear liquids. General instructions  Plan to have someone take you home from the hospital or clinic.  Ask your health care provider about: ? Changing or stopping your regular medicines. This is especially important if you are taking diabetes medicines or blood thinners. ? Taking medicines such as aspirin and ibuprofen. These medicines and other NSAIDs can thin your blood. Do not take these medicines for 7 days before your procedure if your health care provider instructs you not to.  You may have tests, such as: ? Blood tests. ? Urine tests. ? Imaging tests, such as a CT scan. What happens during the procedure?  To lower your risk of infection: ? Your health care team will wash or sanitize their hands. ? Your skin will be washed with soap.  An IV tube will be inserted into one of your veins. This tube will give you fluids and medicines.  You will be given one or more of the following: ? A medicine to help you relax (sedative). ? A medicine to make you fall asleep (general anesthetic).  A water-filled cushion may be placed behind your kidney or on your abdomen. In some cases you may be placed in a tub of lukewarm water.  Your body will be positioned in a way that makes it easy to target the kidney   stone.  A flexible tube with holes in it (stent) may be placed in the ureter. This will help keep urine flowing from the kidney if the fragments of the stone have been blocking the ureter.  An X-ray or ultrasound exam will be done to locate your stone.  Shock waves will be aimed at the stone. If you are awake, you may feel a tapping sensation as the shock waves pass through your body. The procedure may vary among health care  providers and hospitals. What happens after the procedure?  You may have an X-ray to see whether the procedure was able to break up the kidney stone and how much of the stone has passed. If large stone fragments remain after treatment, you may need to have a second procedure at a later time.  Your blood pressure, heart rate, breathing rate, and blood oxygen level will be monitored until the medicines you were given have worn off.  You may be given antibiotics or pain medicine as needed.  If a stent was placed in your ureter during surgery, it may stay in place for a few weeks.  You may need strain your urine to collect pieces of the kidney stone for testing.  You will need to drink plenty of water.  Do not drive for 24 hours if you were given a sedative. Summary  Lithotripsy is a treatment that can sometimes help eliminate kidney stones and the pain that they cause.  A form of lithotripsy, also known as extracorporeal shock wave lithotripsy, is a nonsurgical procedure that crushes a kidney stone with shock waves.  Generally, this is a safe procedure. However, problems may occur, including damage to the kidney or other organs, infection, or obstruction of the tube that carries urine from the kidney to the bladder (ureter).  When you go home, you will need to drink plenty of water. You may be asked to strain your urine to collect pieces of the kidney stone for testing. This information is not intended to replace advice given to you by your health care provider. Make sure you discuss any questions you have with your health care provider. Document Released: 08/27/2000 Document Revised: 12/11/2018 Document Reviewed: 07/21/2016 Elsevier Patient Education  2020 [employer]   Lithotripsy, Care After This sheet gives you information about how to care for yourself after your procedure. Your health care provider may also give you more specific instructions. If you have problems or questions,  contact your health care provider. What can I expect after the procedure? After the procedure, it is common to have:  Some blood in your urine. This should only last for a few days.  Soreness in your back, sides, or upper abdomen for a few days.  Blotches or bruises on your back where the pressure wave entered the skin.  Pain, discomfort, or nausea when pieces (fragments) of the kidney stone move through the tube that carries urine from the kidney to the bladder (ureter). Stone fragments may pass soon after the procedure, but they may continue to pass for up to 4-8 weeks. ? If you have severe pain or nausea, contact your health care provider. This may be caused by a large stone that was not broken up, and this may mean that you need more treatment.  Some pain or discomfort during urination.  Some pain or discomfort in the lower abdomen or (in men) at the base of the penis. Follow these instructions at home: Medicines  Take over-the-counter and prescription medicines only   as told by your health care provider.  If you were prescribed an antibiotic medicine, take it as told by your health care provider. Do not stop taking the antibiotic even if you start to feel better.  Do not drive for 24 hours if you were given a medicine to help you relax (sedative).  Do not drive or use heavy machinery while taking prescription pain medicine. Eating and drinking      Drink enough water and fluids to keep your urine clear or pale yellow. This helps any remaining pieces of the stone to pass. It can also help prevent new stones from forming.  Eat plenty of fresh fruits and vegetables.  Follow instructions from your health care provider about eating and drinking restrictions. You may be instructed: ? To reduce how much salt (sodium) you eat or drink. Check ingredients and nutrition facts on packaged foods and beverages. ? To reduce how much meat you eat.  Eat the recommended amount of calcium for  your age and gender. Ask your health care provider how much calcium you should have. General instructions  Get plenty of rest.  Most people can resume normal activities 1-2 days after the procedure. Ask your health care provider what activities are safe for you.  Your health care provider may direct you to lie in a certain position (postural drainage) and tap firmly (percuss) over your kidney area to help stone fragments pass. Follow instructions as told by your health care provider.  If directed, strain all urine through the strainer that was provided by your health care provider. ? Keep all fragments for your health care provider to see. Any stones that are found may be sent to a medical lab for examination. The stone may be as small as a grain of salt.  Keep all follow-up visits as told by your health care provider. This is important. Contact a health care provider if:  You have pain that is severe or does not get better with medicine.  You have nausea that is severe or does not go away.  You have blood in your urine longer than your health care provider told you to expect.  You have more blood in your urine.  You have pain during urination that does not go away.  You urinate more frequently than usual and this does not go away.  You develop a rash or any other possible signs of an allergic reaction. Get help right away if:  You have severe pain in your back, sides, or upper abdomen.  You have severe pain while urinating.  Your urine is very dark red.  You have blood in your stool (feces).  You cannot pass any urine at all.  You feel a strong urge to urinate after emptying your bladder.  You have a fever or chills.  You develop shortness of breath, difficulty breathing, or chest pain.  You have severe nausea that leads to persistent vomiting.  You faint. Summary  After this procedure, it is common to have some pain, discomfort, or nausea when pieces (fragments)  of the kidney stone move through the tube that carries urine from the kidney to the bladder (ureter). If this pain or nausea is severe, however, you should contact your health care provider.  Most people can resume normal activities 1-2 days after the procedure. Ask your health care provider what activities are safe for you.  Drink enough water and fluids to keep your urine clear or pale yellow. This helps any remaining   pieces of the stone to pass, and it can help prevent new stones from forming.  If directed, strain your urine and keep all fragments for your health care provider to see. Fragments or stones may be as small as a grain of salt.  Get help right away if you have severe pain in your back, sides, or upper abdomen or have severe pain while urinating. This information is not intended to replace advice given to you by your health care provider. Make sure you discuss any questions you have with your health care provider. Document Released: 09/19/2007 Document Revised: 12/11/2018 Document Reviewed: 07/21/2016 Elsevier Patient Education  2020 Elsevier Inc.  

## 2019-04-26 NOTE — Progress Notes (Signed)
   04/26/2019 3:29 PM   Kyna Kara Mead 01-12-1986 [id number]  Reason for visit: Follow up urolithiasis  HPI: I saw Ms. Knapke back in urology clinic today for follow-up.  She is a 33 year old female that underwent left ureteroscopy, laser lithotripsy, and stent placement on 03/08/2019 4 left distal ureteral stones measuring 7 mm and 4 mm in size.  Follow-up renal ultrasound showed no evidence of silent hydronephrosis.  She has a 6 mm right lower pole stone remaining.  She is interested in pursuing elective treatment of the stone.  She did not tolerate her stent well and had significant amount of flank pain and dysuria.  Stone analysis showed calcium oxalate stones.  ROS: Please see flowsheet from today's date for complete review of systems.  Physical Exam: BP (!) 153/94   Pulse 88   Ht 5\' 4"  (1.626 m)   Wt 225 lb (102.1 kg)   BMI 38.62 kg/m     Pertinent Imaging: I personally reviewed the imaging. No evidence of hydronephrosis on follow-up renal ultrasound CT and KUB reviewed, right lower pole stone 6 mm in size, easily seen on KUB, 750HU, 12cm SSD.  Assessment & Plan:   In summary, the patient is a 33 year old female status post left ureteroscopy on 03/08/2023 left ureteral stones, with no silent hydronephrosis on follow-up ultrasound.  She has a right 6 mm lower pole stone that she desires elective intervention for.  We discussed treatment options including observation, ureteroscopy and laser lithotripsy, or shockwave lithotripsy.  Secondary to her severe pain from her prior ureteral stent, she would like to proceed with shockwave lithotripsy.  This is very reasonable, and she has favorable stone location, size, density, and skin to stone distance.  We discussed the risks and benefits at length including bleeding, infection, postoperative pain, Steinstrasse, lower stone free rate than ureteroscopy, and need for additional procedures.  We also discussed general stone prevention  strategies including adequate hydration with goal of producing 2.5 L of urine daily, increasing citric acid intake, increasing calcium intake during high oxalate meals, minimizing animal protein, and decreasing salt intake. Information about dietary recommendations given today.   Schedule right shockwave lithotripsy for 6 mm right lower pole stone Consider 41-LKGM urine/metabolic work-up in follow-up  A total of 15 minutes were spent face-to-face with the patient, greater than 50% was spent in patient education, counseling, and coordination of care regarding stone treatment options and stone prevention.   Billey Co, [facility] [address] [place], [place] [address] [phone]

## 2019-05-14 ENCOUNTER — Other Ambulatory Visit: Payer: Self-pay

## 2019-05-14 ENCOUNTER — Other Ambulatory Visit
Admission: RE | Admit: 2019-05-14 | Discharge: 2019-05-14 | Disposition: A | Discharge status: Home / Self Care | Payer: BC Managed Care – PPO | Source: Ambulatory Visit | Attending: Urology | Admitting: Urology

## 2019-05-14 DIAGNOSIS — Z20828 Contact with and (suspected) exposure to other viral communicable diseases: Secondary | ICD-10-CM | POA: Insufficient documentation

## 2019-05-14 DIAGNOSIS — N200 Calculus of kidney: Secondary | ICD-10-CM | POA: Diagnosis not present

## 2019-05-14 DIAGNOSIS — Z01812 Encounter for preprocedural laboratory examination: Secondary | ICD-10-CM | POA: Diagnosis present

## 2019-05-14 LAB — SARS CORONAVIRUS 2 (TAT 6-24 HRS): SARS Coronavirus 2: NEGATIVE

## 2019-05-17 ENCOUNTER — Ambulatory Visit: Payer: BC Managed Care – PPO

## 2019-05-17 ENCOUNTER — Other Ambulatory Visit: Payer: Self-pay

## 2019-05-17 ENCOUNTER — Encounter: Payer: Self-pay | Admitting: *Deleted

## 2019-05-17 ENCOUNTER — Ambulatory Visit
Admission: RE | Admit: 2019-05-17 | Discharge: 2019-05-17 | Disposition: A | Discharge status: Home / Self Care | Payer: BC Managed Care – PPO | Attending: Urology | Admitting: Urology

## 2019-05-17 ENCOUNTER — Encounter
Admission: RE | Disposition: A | Discharge status: Home / Self Care | Payer: Self-pay | Source: Home / Self Care | Attending: Urology

## 2019-05-17 DIAGNOSIS — N200 Calculus of kidney: Secondary | ICD-10-CM | POA: Diagnosis not present

## 2019-05-17 HISTORY — PX: EXTRACORPOREAL SHOCK WAVE LITHOTRIPSY: SHX1557

## 2019-05-17 LAB — URINALYSIS, ROUTINE W REFLEX MICROSCOPIC
Bilirubin Urine: NEGATIVE
Glucose, UA: NEGATIVE mg/dL
Hgb urine dipstick: NEGATIVE
Ketones, ur: NEGATIVE mg/dL
Leukocytes,Ua: NEGATIVE
Nitrite: NEGATIVE
Protein, ur: NEGATIVE mg/dL
Specific Gravity, Urine: 1.017 (ref 1.005–1.030)
pH: 5 (ref 5.0–8.0)

## 2019-05-17 SURGERY — LITHOTRIPSY, ESWL
Anesthesia: Moderate Sedation | Laterality: Right

## 2019-05-17 MED ORDER — SODIUM CHLORIDE 0.9 % IV SOLN: INTRAVENOUS | Status: DC

## 2019-05-17 MED ORDER — CIPROFLOXACIN HCL 500 MG PO TABS
500.0000 mg | ORAL_TABLET | ORAL | Status: AC
  Administered 2019-05-17: 09:00:00 500 mg via ORAL

## 2019-05-17 MED ORDER — DIAZEPAM 5 MG PO TABS
10.0000 mg | ORAL_TABLET | ORAL | Status: AC
  Administered 2019-05-17: 10 mg via ORAL

## 2019-05-17 MED ORDER — DIAZEPAM 5 MG PO TABS
ORAL_TABLET | ORAL | Status: AC
  Filled 2019-05-17: qty 2

## 2019-05-17 MED ORDER — DIPHENHYDRAMINE HCL 25 MG PO CAPS
ORAL_CAPSULE | ORAL | Status: AC
  Filled 2019-05-17: qty 1

## 2019-05-17 MED ORDER — HYDROCODONE-ACETAMINOPHEN 5-325 MG PO TABS
ORAL_TABLET | ORAL | Status: AC
  Filled 2019-05-17: qty 1

## 2019-05-17 MED ORDER — HYDROCODONE-ACETAMINOPHEN 5-325 MG PO TABS: 1.0000 | ORAL_TABLET | ORAL | 0 refills | Status: AC | PRN

## 2019-05-17 MED ORDER — TAMSULOSIN HCL 0.4 MG PO CAPS: 0.4000 mg | ORAL_CAPSULE | Freq: Every day | ORAL | 0 refills | Status: DC

## 2019-05-17 MED ORDER — DIPHENHYDRAMINE HCL 25 MG PO CAPS
25.0000 mg | ORAL_CAPSULE | ORAL | Status: AC
  Administered 2019-05-17: 25 mg via ORAL

## 2019-05-17 MED ORDER — ONDANSETRON HCL 4 MG/2ML IJ SOLN
INTRAMUSCULAR | Status: AC
  Filled 2019-05-17: qty 2

## 2019-05-17 MED ORDER — CIPROFLOXACIN HCL 500 MG PO TABS
ORAL_TABLET | ORAL | Status: AC
  Filled 2019-05-17: qty 1

## 2019-05-17 MED ORDER — HYDROCODONE-ACETAMINOPHEN 5-325 MG PO TABS
1.0000 | ORAL_TABLET | ORAL | Status: DC | PRN
  Administered 2019-05-17: 1 via ORAL

## 2019-05-17 MED ORDER — ONDANSETRON HCL 4 MG/2ML IJ SOLN
4.0000 mg | Freq: Once | INTRAMUSCULAR | Status: AC
  Administered 2019-05-17: 09:00:00 4 mg via INTRAVENOUS

## 2019-05-17 NOTE — H&P (Addendum)
UROLOGY H&P UPDATE  Agree with prior H&P dated 04/26/19. Patient with asymptomatic right 78mm lower pole stone. She did not tolerate URS and stent well from her prior left URS for distal ureteral stone and desires elective SWL.  Cardiac: RRR Lungs: CTA bilaterally  Laterality: RIGHT Procedure: right SWL  Urine:  Urine culture was ordered pre-op but not completed for unclear reasons Urinalysis today nitrite negative, no leukocytes Urine cx 03/06/19 25k mixed urogenital flora  Informed consent obtained, we specifically discussed the risks of bleeding, infection, post-operative pain, need for additional procedures, steinstrasse, need for additional procedures.  Billey Co, MD 05/17/2019

## 2019-05-17 NOTE — Discharge Instructions (Addendum)
AMBULATORY SURGERY  DISCHARGE INSTRUCTIONS   1) The drugs that you were given will stay in your system until tomorrow so for the next 24 hours you should not:  A) Drive an automobile B) Make any legal decisions C) Drink any alcoholic beverage   2) You may resume regular meals tomorrow.  Today it is better to start with liquids and gradually work up to solid foods.  You may eat anything you prefer, but it is better to start with liquids, then soup and crackers, and gradually work up to solid foods.   3) Please notify your doctor immediately if you have any unusual bleeding, trouble breathing, redness and pain at the surgery site, drainage, fever, or pain not relieved by medication.    4) Additional Instructions:   Follow [facility] discharge instruction sheet as reviewed.        Please contact your physician with any problems or Same Day Surgery at [phone], Monday through Friday 6 am to 4 pm, or [place] at [facility] number at [phone].

## 2019-05-18 LAB — URINE CULTURE: Culture: NO GROWTH

## 2019-05-25 ENCOUNTER — Telehealth: Payer: Self-pay | Admitting: Urology

## 2019-05-25 NOTE — Telephone Encounter (Signed)
Spoke to patient and informed her that she is probably trying to pass the stone fragments and this is painful until the fragments are all passed. Patient voiced understanding.

## 2019-05-25 NOTE — Telephone Encounter (Signed)
Pt had litho last Thursday and is having pain in lower right abdomen and wants to know if this is normal.

## 2019-05-31 ENCOUNTER — Other Ambulatory Visit: Payer: Self-pay | Admitting: Radiology

## 2019-05-31 ENCOUNTER — Ambulatory Visit (INDEPENDENT_AMBULATORY_CARE_PROVIDER_SITE_OTHER): Payer: BC Managed Care – PPO | Admitting: Urology

## 2019-05-31 ENCOUNTER — Ambulatory Visit
Admission: RE | Admit: 2019-05-31 | Discharge: 2019-05-31 | Disposition: A | Discharge status: Home / Self Care | Payer: BC Managed Care – PPO | Source: Ambulatory Visit | Attending: Urology | Admitting: Urology

## 2019-05-31 ENCOUNTER — Other Ambulatory Visit: Payer: Self-pay

## 2019-05-31 ENCOUNTER — Encounter: Payer: Self-pay | Admitting: Urology

## 2019-05-31 VITALS — BP 128/82 | HR 83 | Ht 63.0 in | Wt 234.0 lb

## 2019-05-31 DIAGNOSIS — N200 Calculus of kidney: Secondary | ICD-10-CM | POA: Insufficient documentation

## 2019-05-31 NOTE — Progress Notes (Signed)
   05/31/2019 10:35 AM   Luetta Nutting Kara Mead 29-Oct-1985 [id number]  Reason for visit: Follow up right SWL  HPI: I saw Ms. Bartosiewicz back in urology clinic for follow-up after shockwave lithotripsy.  She is a 33 year old female that underwent elective right shockwave lithotripsy for a an asymptomatic right 6 mm lower pole stone.  She previously had ureteroscopy on the other side in June 2020 and did not tolerate her stent well.  She has had some mild right-sided flank pain after shockwave, however has resolved.  She passed some small stone fragments.  He denies any fevers or chills.  KUB today shows fragmentation of the stone, and some residual fragments in the right lower pole, no stones overlying the ureter  Stone type previously was calcium oxalate   ROS: Please see flowsheet from today's date for complete review of systems.  Physical Exam: BP 128/82   Pulse 83   Ht 5\' 3"  (1.6 m)   Wt 234 lb (106.1 kg)   BMI 41.45 kg/m     Assessment & Plan:   In summary, she is a 33 year old female with nephrolithiasis status post right shockwave lithotripsy with good fragmentation of the stone and some residual asymptomatic small fragments in the lower pole.  We discussed general stone prevention strategies including adequate hydration with goal of producing 2.5 L of urine daily, increasing citric acid intake, increasing calcium intake during high oxalate meals, minimizing animal protein, and decreasing salt intake. Information about dietary recommendations given today.  I recommended starting with a litholyte packets twice daily  RTC 1 year for KUB Consider metabolic work-up in 49-SWHQ urine in the future if recurrent stones  A total of 15 minutes were spent face-to-face with the patient, greater than 50% was spent in patient education, counseling, and coordination of care regarding nephrolithiasis and prevention.  Billey Co, [facility] [address], [place], [place] [address] [phone]

## 2019-07-30 DIAGNOSIS — M25532 Pain in left wrist: Secondary | ICD-10-CM | POA: Insufficient documentation

## 2019-08-08 ENCOUNTER — Telehealth: Payer: Self-pay

## 2019-08-08 ENCOUNTER — Encounter: Payer: Self-pay | Admitting: Urology

## 2019-08-08 ENCOUNTER — Ambulatory Visit: Payer: BC Managed Care – PPO | Admitting: Urology

## 2019-08-08 ENCOUNTER — Ambulatory Visit
Admission: RE | Admit: 2019-08-08 | Discharge: 2019-08-08 | Disposition: A | Discharge status: Home / Self Care | Payer: BC Managed Care – PPO | Attending: Urology | Admitting: Urology

## 2019-08-08 ENCOUNTER — Other Ambulatory Visit: Payer: Self-pay

## 2019-08-08 ENCOUNTER — Ambulatory Visit
Admission: RE | Admit: 2019-08-08 | Discharge: 2019-08-08 | Disposition: A | Discharge status: Home / Self Care | Payer: BC Managed Care – PPO | Source: Ambulatory Visit | Attending: Urology | Admitting: Urology

## 2019-08-08 VITALS — BP 155/85 | HR 89 | Ht 63.0 in | Wt 234.0 lb

## 2019-08-08 DIAGNOSIS — N201 Calculus of ureter: Secondary | ICD-10-CM | POA: Diagnosis present

## 2019-08-08 DIAGNOSIS — N200 Calculus of kidney: Secondary | ICD-10-CM | POA: Insufficient documentation

## 2019-08-08 DIAGNOSIS — Z87442 Personal history of urinary calculi: Secondary | ICD-10-CM | POA: Diagnosis not present

## 2019-08-08 DIAGNOSIS — R310 Gross hematuria: Secondary | ICD-10-CM | POA: Diagnosis not present

## 2019-08-08 DIAGNOSIS — R39198 Other difficulties with micturition: Secondary | ICD-10-CM | POA: Diagnosis not present

## 2019-08-08 DIAGNOSIS — R109 Unspecified abdominal pain: Secondary | ICD-10-CM | POA: Diagnosis not present

## 2019-08-08 LAB — BLADDER SCAN AMB NON-IMAGING: Scan Result: 42

## 2019-08-08 LAB — URINALYSIS, COMPLETE
Bilirubin, UA: NEGATIVE
Glucose, UA: NEGATIVE
Ketones, UA: NEGATIVE
Leukocytes,UA: NEGATIVE
Nitrite, UA: NEGATIVE
Specific Gravity, UA: 1.025 (ref 1.005–1.030)
Urobilinogen, Ur: 0.2 mg/dL (ref 0.2–1.0)
pH, UA: 5.5 (ref 5.0–7.5)

## 2019-08-08 LAB — MICROSCOPIC EXAMINATION: RBC, Urine: >30 /hpf — AB (ref 0–2)

## 2019-08-08 MED ORDER — OXYCODONE-ACETAMINOPHEN 10-325 MG PO TABS: 1.0000 | ORAL_TABLET | ORAL | 0 refills | Status: DC | PRN

## 2019-08-08 MED ORDER — TAMSULOSIN HCL 0.4 MG PO CAPS: 0.4000 mg | ORAL_CAPSULE | Freq: Every day | ORAL | 3 refills | Status: DC

## 2019-08-08 NOTE — Telephone Encounter (Signed)
Called pt she states that she is experiencing severe cramping this morning lower abdominal. Has pass a small stone fragment this morning. Bowel movement this morning. Spoke with Zara Council who asks that pt should come into the office to have a repeat KUB and u/a to rule out infection. Pt agreeable. KUB ordered. Pt added to schedule.

## 2019-08-08 NOTE — Progress Notes (Signed)
08/08/2019 8:40 PM   Taylor Mercer 1986-02-22 [id number]  Referring provider: Elon Alas, MD [place],  [place] [address]  Chief Complaint  Patient presents with  . Nephrolithiasis  . Abdominal Pain    HPI: Taylor Mercer is a 33 year old female with history of nephrolithiasis who passed a small stone this morning but is still experiencing gross hematuria and lower abdominal pain.  She underwent left ureteroscopy with laser lithotripsy and stent placement for 7 mm and 4 mm stone in the left distal ureter associated with hydronephrosis on March 08, 2019 with Dr. Diamantina Providence.  Her follow-up renal ultrasound did not demonstrate any iatrogenic hydronephrosis.  She did not tolerate her ureteral stent.  She then underwent right ESWL on 05/17/2019 with Dr. Diamantina Providence.  She experienced good fragmentation of the stone with some asymptomatic small residual fragments in the lower pole.  Stone composition was calcium oxalate.  Last evening, she began to have cramping in her lower abdominal region and urinary retention.  She spent the rest of the evening pushing fluids and she was able to pass a small fragment.  She is still experiencing lower abdominal cramping that is radiating up into her left waist.  She describes the pain as cramping and very intense.  It has been consistent this morning.  She is having associated gross hematuria and urinary difficultly.  She states that she is not constipated or having diarrhea.  She is having some nausea, but is controlled with some nausea medicine she has at home.  She denied any dysuria, fever, chills or vomiting.    Her UA is positive for 3+ blood, 1+ protein, 0-5 WBCs, greater than 30 RBCs, 0-10 epithelial cells, granular casts, crystals are present and few bacteria.  Bladder scan 42 mL.    KUB demonstrates a possible small radiopaque fragment in the right hemipelvis that could be an ureteral stone.  PMH: Past Medical History:   Diagnosis Date  . Anemia    WITH PREGNANCY  . Complication of anesthesia   . Endometriosis   . Family history of adverse reaction to anesthesia    DAD-N/V  . GERD (gastroesophageal reflux disease)    OCC  . Heart murmur    AS A CHILD-ASYMPTOMATIC  . Hypertension   . Kidney stones   . Kidney stones   . Migraine    MIGRAINES  . PONV (postoperative nausea and vomiting)     Surgical History: Past Surgical History:  Procedure Laterality Date  . ABDOMINAL HYSTERECTOMY    . APPENDECTOMY    . CESAREAN SECTION     X2  . CYST EXCISION    . CYSTOSCOPY/URETEROSCOPY/HOLMIUM LASER/STENT PLACEMENT Left 03/08/2019   Procedure: CYSTOSCOPY/URETEROSCOPY/HOLMIUM LASER/STENT PLACEMENT;  Surgeon: Billey Co, MD;  Location: [facility];  Service: Urology;  Laterality: Left;  . EXTRACORPOREAL SHOCK WAVE LITHOTRIPSY Right 05/17/2019   Procedure: EXTRACORPOREAL SHOCK WAVE LITHOTRIPSY (ESWL);  Surgeon: Billey Co, MD;  Location: [facility];  Service: Urology;  Laterality: Right;    Home Medications:  Allergies as of 08/08/2019   No Known Allergies     Medication List       Accurate as of August 08, 2019  8:40 PM. If you have any questions, ask your nurse or doctor.        chlorthalidone 25 MG tablet Commonly known as: HYGROTON Take 1 tablet (25 mg total) by mouth daily. What changed: when to take this   fluticasone 50 MCG/ACT nasal  spray Commonly known as: FLONASE   ibuprofen 800 MG tablet Commonly known as: ADVIL Take 800 mg by mouth every 8 (eight) hours as needed.   omeprazole 20 MG capsule Commonly known as: PRILOSEC Take 20 mg by mouth daily.   ondansetron 4 MG disintegrating tablet Commonly known as: ZOFRAN-ODT Take 4 mg by mouth every 8 (eight) hours as needed.   oxyCODONE-acetaminophen 10-325 MG tablet Commonly known as: Percocet Take 1 tablet by mouth every 4 (four) hours as needed for pain. Started by: Michiel CowboySHANNON Delphine Sizemore, PA-C   propranolol 10 MG tablet  Commonly known as: INDERAL Take 10 mg by mouth 2 (two) times a day.   SUMAtriptan 50 MG tablet Commonly known as: IMITREX Take 50 mg by mouth every 2 (two) hours as needed for migraine or headache.   tamsulosin 0.4 MG Caps capsule Commonly known as: FLOMAX Take 1 capsule (0.4 mg total) by mouth daily after supper. What changed: Another medication with the same name was added. Make sure you understand how and when to take each. Changed by: Michiel CowboySHANNON Saranda Legrande, PA-C   tamsulosin 0.4 MG Caps capsule Commonly known as: FLOMAX Take 1 capsule (0.4 mg total) by mouth daily. What changed: You were already taking a medication with the same name, and this prescription was added. Make sure you understand how and when to take each. Changed by: Michiel CowboySHANNON Wednesday Ericsson, PA-C       Allergies: No Known Allergies  Family History: Family History  Problem Relation Age of Onset  . Hypertension Mother   . Cancer Father     Social History:  reports that she has never smoked. She has never used smokeless tobacco. She reports that she does not drink alcohol or use drugs.  ROS: UROLOGY Frequent Urination?: No Hard to postpone urination?: No Burning/pain with urination?: No Get up at night to urinate?: No Leakage of urine?: No Urine stream starts and stops?: No Trouble starting stream?: No Do you have to strain to urinate?: No Blood in urine?: Yes Urinary tract infection?: No Sexually transmitted disease?: No Injury to kidneys or bladder?: No Painful intercourse?: No Weak stream?: No Currently pregnant?: No Vaginal bleeding?: No Last menstrual period?: Hysterectomy  Gastrointestinal Nausea?: No Vomiting?: No Indigestion/heartburn?: No Diarrhea?: No Constipation?: No  Constitutional Fever: No Night sweats?: No Weight loss?: No Fatigue?: No  Skin Skin rash/lesions?: No Itching?: No  Eyes Blurred vision?: No Double vision?: No  Ears/Nose/Throat Sore throat?: No Sinus problems?: No   Hematologic/Lymphatic Swollen glands?: No Easy bruising?: No  Cardiovascular Leg swelling?: No Chest pain?: No  Respiratory Cough?: No Shortness of breath?: No  Endocrine Excessive thirst?: No  Musculoskeletal Back pain?: No Joint pain?: No  Neurological Headaches?: Yes Dizziness?: No  Psychologic Depression?: No Anxiety?: No  Physical Exam: BP (!) 155/85 (BP Location: Left Arm, Patient Position: Sitting, Cuff Size: Normal)   Pulse 89   Ht 5\' 3"  (1.6 m)   Wt 234 lb (106.1 kg)   BMI 41.45 kg/m   Constitutional:  Well nourished. Alert and oriented, No acute distress. HEENT: [place] AT, moist mucus membranes.  Trachea midline, no masses. Cardiovascular: No clubbing, cyanosis, or edema. Respiratory: Normal respiratory effort, no increased work of breathing. GI: Abdomen with mild tenderness in the left side, but soft, non distended, no abdominal masses. Liver and spleen not palpable.  No hernias appreciated.  Stool sample for occult testing is not indicated.   GU: No CVA tenderness.  No bladder fullness or masses.   Skin: No rashes,  bruises or suspicious lesions. Lymph: No cervical or inguinal adenopathy. Neurologic: Grossly intact, no focal deficits, moving all 4 extremities. Psychiatric: Normal mood and affect.  Laboratory Data: Lab Results  Component Value Date   WBC 8.6 03/01/2019   HGB 13.0 03/01/2019   HCT 37.5 03/01/2019   MCV 82.1 03/01/2019   PLT 251 03/01/2019    Lab Results  Component Value Date   CREATININE 0.93 03/01/2019    No results found for: PSA  No results found for: TESTOSTERONE  No results found for: HGBA1C  No results found for: TSH  No results found for: CHOL, HDL, CHOLHDL, VLDL, LDLCALC  Lab Results  Component Value Date   AST 28 03/01/2019   Lab Results  Component Value Date   ALT 36 03/01/2019   No components found for: ALKALINEPHOPHATASE No components found for: BILIRUBINTOTAL  No results found for: ESTRADIOL   Urinalysis Component     Latest Ref Rng & Units 08/08/2019  Specific Gravity, UA     1.005 - 1.030 1.025  pH, UA     5.0 - 7.5 5.5  Color, UA     Yellow Yellow  Appearance Ur     Clear Cloudy (A)  Leukocytes,UA     Negative Negative  Protein,UA     Negative/Trace 1+ (A)  Glucose, UA     Negative Negative  Ketones, UA     Negative Negative  RBC, UA     Negative 3+ (A)  Bilirubin, UA     Negative Negative  Urobilinogen, Ur     0.2 - 1.0 mg/dL 0.2  Nitrite, UA     Negative Negative  Microscopic Examination      See below:   Component     Latest Ref Rng & Units 08/08/2019  WBC, UA     0 - 5 /hpf 0-5  RBC     0 - 2 /hpf >30 (A)  Epithelial Cells (non renal)     0 - 10 /hpf 0-10  Casts     None seen /lpf Present (A)  Cast Type     N/A Granular casts (A)  Crystals     N/A Present (A)  Crystal Type     N/A Amorphous Sediment  Bacteria, UA     None seen/Few Few    I have reviewed the labs.   Pertinent Imaging: Results for MALINI, FLEMINGS (MRN [id number]) as of 08/08/2019 20:40  Ref. Range 08/08/2019 20:39  Scan Result Unknown 42 mL   CLINICAL DATA:  33 year old female with kidney stones.  EXAM: ABDOMEN - 1 VIEW  COMPARISON:  Abdominal radiograph dated 05/31/2019.  FINDINGS: Bilateral radiopaque foci over the renal silhouette consistent with kidney stones. Several small radiopaque foci noted over the right hemipelvis which are new since the prior radiograph and may represent stone in the distal right ureter or within the bladder. The osseous structures and soft tissues are unremarkable.  IMPRESSION: 1. Bilateral nephrolithiasis. 2. Several small radiopaque foci noted over the right hemipelvis may represent stones in the distal right ureter or within the bladder.   Electronically Signed   By: Elgie Collard M.D.   On: 08/08/2019 13:00 I have independently reviewed the films and identify a possible right ureteral stone.  Reviewed  films and plan with Dr. Mena Goes as he is on call physician.   Assessment & Plan:    1.  Difficulty urinating/abdominal cramping Bladder scan minimal Likely due to migrating stone fragment Prescribe tamsulosin 0.4 mg daily  and Percocet 10/325, #10 to take every 6 hours as needed for pain PDMP is reviewed UA positive for hematuria - will sent for culture to rule out infection -will wait to prescribe antibiotic until cultures are available RTC in 2 weeks for symptom recheck with a RUS Patient is advised that if they should start to experience pain that is not able to be controlled with pain medication, intractable nausea and/or vomiting and/or fevers greater than 103 or shaking chills to contact the office immediately or seek treatment in the emergency department for emergent intervention.    2. Gross hematuria Likely due to migrating stone Will continue to monitor  3. History of nephrolithiasis Underwent left URS and right ES WL earlier this year Stone composition is calcium oxalate    Return in about 2 weeks (around 08/22/2019) for symptom recheck .  These notes generated with voice recognition software. I apologize for typographical errors.  Michiel Cowboy, PA-C  [facility] [address]  [address] [place], [place] [address] [phone]

## 2019-08-12 LAB — CULTURE, URINE COMPREHENSIVE

## 2019-08-15 ENCOUNTER — Other Ambulatory Visit: Payer: Self-pay | Admitting: *Deleted

## 2019-08-15 ENCOUNTER — Other Ambulatory Visit: Payer: Self-pay | Admitting: Urology

## 2019-08-15 ENCOUNTER — Telehealth: Payer: Self-pay | Admitting: Urology

## 2019-08-15 DIAGNOSIS — N200 Calculus of kidney: Secondary | ICD-10-CM

## 2019-08-15 NOTE — Telephone Encounter (Signed)
Talk to patient and she is having to urine every 20 -30 min. She is not having pain with  urine and no burning pain. She states she is having pain her  gallbladder. Cramping .

## 2019-08-15 NOTE — Telephone Encounter (Signed)
ORDER IN FOR U/S

## 2019-08-15 NOTE — Telephone Encounter (Signed)
Pt came in to see [facility] last week.  She said she's feeling better, but still unable to empty her bladder.  She feels like she has to go every 20-30 minutes.

## 2019-08-15 NOTE — Telephone Encounter (Signed)
She actually saw me last week.  It sounds like she is passing a stone.  I suggest we get a renal ultrasound scheduled this week to see if she has any hydronephrosis.

## 2019-08-16 ENCOUNTER — Telehealth: Payer: Self-pay | Admitting: Urology

## 2019-08-16 ENCOUNTER — Other Ambulatory Visit: Payer: Self-pay

## 2019-08-16 ENCOUNTER — Ambulatory Visit
Admission: RE | Admit: 2019-08-16 | Discharge: 2019-08-16 | Disposition: A | Discharge status: Home / Self Care | Payer: BC Managed Care – PPO | Source: Ambulatory Visit | Attending: Urology | Admitting: Urology

## 2019-08-16 ENCOUNTER — Other Ambulatory Visit: Payer: Self-pay | Admitting: Urology

## 2019-08-16 DIAGNOSIS — R109 Unspecified abdominal pain: Secondary | ICD-10-CM

## 2019-08-16 DIAGNOSIS — R310 Gross hematuria: Secondary | ICD-10-CM | POA: Diagnosis present

## 2019-08-16 NOTE — Telephone Encounter (Signed)
We can definitely order it stat.

## 2019-08-16 NOTE — Telephone Encounter (Signed)
There are notes from [place] as well on her, and it looks like [place] recommending changing it to stat earlier this morning. Let me know if you need anything from me, thanks  Nickolas Madrid, MD 08/16/2019

## 2019-08-16 NOTE — Progress Notes (Signed)
08/17/2019 10:01 AM   Luetta Nutting Kara Mead Mar 18, 1986 [id number]  Referring provider: Elon Alas, MD [place],  [place] [address]  Chief Complaint  Patient presents with  . Nephrolithiasis    HPI: Mrs. Shinall is a 33 year old female with history of nephrolithiasis with right ureteral stones.  She underwent left ureteroscopy with laser lithotripsy and stent placement for 7 mm and 4 mm stone in the left distal ureter associated with hydronephrosis on March 08, 2019 with Dr. Diamantina Providence.  Her follow-up renal ultrasound did not demonstrate any iatrogenic hydronephrosis.  She did not tolerate her ureteral stent.  She then underwent right ESWL on 05/17/2019 with Dr. Diamantina Providence.  She experienced good fragmentation of the stone with some asymptomatic small residual fragments in the lower pole.  Stone composition was calcium oxalate.  KUB 08/08/2019 demonstrates a possible small radiopaque fragment in the right hemipelvis that could be an ureteral stone.  RUS 08/16/2019 normal  CT Renal stone study 08/16/2019 There are two distal right ureteral calculi measuring 4 x 3 and 2 x 2 mm respectively. There is slight hydronephrosis on the right.  There are nonobstructing calculi in each kidney.  Today, she is still having lower right-sided pressure, intense urgency and frequency.  She is also experiencing dark-colored urine.  Patient denies any gross hematuria or suprapubic/flank pain.  Patient denies any fevers, chills, nausea or vomiting.   PMH: Past Medical History:  Diagnosis Date  . Anemia    WITH PREGNANCY  . Complication of anesthesia   . Endometriosis   . Family history of adverse reaction to anesthesia    DAD-N/V  . GERD (gastroesophageal reflux disease)    OCC  . Heart murmur    AS A CHILD-ASYMPTOMATIC  . Hypertension   . Kidney stones   . Kidney stones   . Migraine    MIGRAINES  . PONV (postoperative nausea and vomiting)     Surgical History: Past  Surgical History:  Procedure Laterality Date  . ABDOMINAL HYSTERECTOMY    . APPENDECTOMY    . CESAREAN SECTION     X2  . CYST EXCISION    . CYSTOSCOPY/URETEROSCOPY/HOLMIUM LASER/STENT PLACEMENT Left 03/08/2019   Procedure: CYSTOSCOPY/URETEROSCOPY/HOLMIUM LASER/STENT PLACEMENT;  Surgeon: Billey Co, MD;  Location: [facility];  Service: Urology;  Laterality: Left;  . EXTRACORPOREAL SHOCK WAVE LITHOTRIPSY Right 05/17/2019   Procedure: EXTRACORPOREAL SHOCK WAVE LITHOTRIPSY (ESWL);  Surgeon: Billey Co, MD;  Location: [facility] OR[facility];  Service: Urology;  Laterality: Right;    Home Medications:  Allergies as of 08/17/2019   No Known Allergies     Medication List       Accurate as of August 17, 2019 10:01 AM. If you have any questions, ask your nurse or doctor.        azelastine 0.1 % nasal spray Commonly known as: ASTELIN 1 spray by Each Nare route Two (2) times a day. Use in each nostril as directed   Cetirizine HCl 10 MG Caps Take by mouth.   chlorthalidone 25 MG tablet Commonly known as: HYGROTON Take 1 tablet (25 mg total) by mouth daily. What changed: when to take this   fluticasone 50 MCG/ACT nasal spray Commonly known as: FLONASE   ibuprofen 800 MG tablet Commonly known as: ADVIL Take 800 mg by mouth every 8 (eight) hours as needed.   ketorolac 10 MG tablet Commonly known as: TORADOL Take 1 tablet (10 mg total) by mouth every 6 (six) hours as  needed. Started by: Zara Council, PA-C   omeprazole 20 MG capsule Commonly known as: PRILOSEC Take 20 mg by mouth daily.   ondansetron 4 MG disintegrating tablet Commonly known as: ZOFRAN-ODT Take 4 mg by mouth every 8 (eight) hours as needed.   oxyCODONE-acetaminophen 10-325 MG tablet Commonly known as: Percocet Take 1 tablet by mouth every 4 (four) hours as needed for pain.   propranolol 10 MG tablet Commonly known as: INDERAL Take 10 mg by mouth 2 (two) times a day.   SUMAtriptan 50 MG tablet Commonly  known as: IMITREX Take 50 mg by mouth every 2 (two) hours as needed for migraine or headache.   tamsulosin 0.4 MG Caps capsule Commonly known as: FLOMAX Take 1 capsule (0.4 mg total) by mouth daily after supper.   tamsulosin 0.4 MG Caps capsule Commonly known as: FLOMAX Take 1 capsule (0.4 mg total) by mouth daily.   tiZANidine 4 MG tablet Commonly known as: ZANAFLEX Take by mouth.       Allergies: No Known Allergies  Family History: Family History  Problem Relation Age of Onset  . Hypertension Mother   . Cancer Father     Social History:  reports that she has never smoked. She has never used smokeless tobacco. She reports that she does not drink alcohol or use drugs.  ROS: UROLOGY Frequent Urination?: Yes Hard to postpone urination?: No Burning/pain with urination?: No Get up at night to urinate?: No Leakage of urine?: No Urine stream starts and stops?: No Trouble starting stream?: No Do you have to strain to urinate?: No Blood in urine?: Yes Urinary tract infection?: No Sexually transmitted disease?: No Injury to kidneys or bladder?: No Painful intercourse?: No Weak stream?: No Currently pregnant?: No Vaginal bleeding?: No Last menstrual period?: n  Gastrointestinal Nausea?: No Vomiting?: No Indigestion/heartburn?: No Diarrhea?: No Constipation?: No  Constitutional Fever: No Night sweats?: No Weight loss?: No Fatigue?: No  Skin Skin rash/lesions?: No Itching?: No  Eyes Blurred vision?: No Double vision?: No  Ears/Nose/Throat Sore throat?: No Sinus problems?: No  Hematologic/Lymphatic Swollen glands?: No Easy bruising?: No  Cardiovascular Leg swelling?: No Chest pain?: No  Respiratory Cough?: No Shortness of breath?: No  Endocrine Excessive thirst?: No  Musculoskeletal Back pain?: No Joint pain?: No  Neurological Headaches?: No Dizziness?: No  Psychologic Depression?: No Anxiety?: No  Physical Exam: BP 120/83    Pulse 67   Ht 5' 3"  (1.6 m)   Wt 233 lb 9.6 oz (106 kg)   BMI 41.38 kg/m   Constitutional:  Well nourished. Alert and oriented, No acute distress. HEENT: [place] AT, moist mucus membranes.  Trachea midline, no masses. Cardiovascular: No clubbing, cyanosis, or edema. Respiratory: Normal respiratory effort, no increased work of breathing. Neurologic: Grossly intact, no focal deficits, moving all 4 extremities. Psychiatric: Normal mood and affect.   Laboratory Data: Lab Results  Component Value Date   WBC 8.6 03/01/2019   HGB 13.0 03/01/2019   HCT 37.5 03/01/2019   MCV 82.1 03/01/2019   PLT 251 03/01/2019    Lab Results  Component Value Date   CREATININE 0.93 03/01/2019    No results found for: PSA  No results found for: TESTOSTERONE  No results found for: HGBA1C  No results found for: TSH  No results found for: CHOL, HDL, CHOLHDL, VLDL, LDLCALC  Lab Results  Component Value Date   AST 28 03/01/2019   Lab Results  Component Value Date   ALT 36 03/01/2019   No components  found for: ALKALINEPHOPHATASE No components found for: BILIRUBINTOTAL  No results found for: ESTRADIOL  Urinalysis Component     Latest Ref Rng & Units 08/08/2019  Specific Gravity, UA     1.005 - 1.030 1.025  pH, UA     5.0 - 7.5 5.5  Color, UA     Yellow Yellow  Appearance Ur     Clear Cloudy (A)  Leukocytes,UA     Negative Negative  Protein,UA     Negative/Trace 1+ (A)  Glucose, UA     Negative Negative  Ketones, UA     Negative Negative  RBC, UA     Negative 3+ (A)  Bilirubin, UA     Negative Negative  Urobilinogen, Ur     0.2 - 1.0 mg/dL 0.2  Nitrite, UA     Negative Negative  Microscopic Examination      See below:   Component     Latest Ref Rng & Units 08/08/2019  WBC, UA     0 - 5 /hpf 0-5  RBC     0 - 2 /hpf >30 (A)  Epithelial Cells (non renal)     0 - 10 /hpf 0-10  Casts     None seen /lpf Present (A)  Cast Type     N/A Granular casts (A)  Crystals     N/A  Present (A)  Crystal Type     N/A Amorphous Sediment  Bacteria, UA     None seen/Few Few    I have reviewed the labs.   Pertinent Imaging: CLINICAL DATA:  Right flank and lower quadrant pain.  Hematuria  EXAM: CT ABDOMEN AND PELVIS WITHOUT CONTRAST  TECHNIQUE: Multidetector CT imaging of the abdomen and pelvis was performed following the standard protocol without oral or IV contrast.  COMPARISON:  March 01, 2019.  FINDINGS: Lower chest: Lung bases are clear.  Hepatobiliary: There is hepatic steatosis. No focal liver lesions are evident on this noncontrast enhanced study. Liver measures 24.2 cm in length. Gallbladder wall is not appreciably thickened. There is no biliary duct dilatation.  Pancreas: There is no pancreatic mass or inflammatory focus.  Spleen: Spleen measures 13.5 x 11.4 x 6.9 cm with a measured splenic volume of 531 cubic cm. No focal splenic lesions are evident.  Adrenals/Urinary Tract: Adrenals bilaterally appear normal. There is slight hydronephrosis on the right. There is no appreciable hydronephrosis on the left. There is no renal mass on either side. There is a calculus in the lower pole of the right kidney measuring 9 x 6 mm. There is a nearby 2 mm calculus in the lower pole the right kidney. On the left, there is a 4 mm calculus in the lower pole region. There is a 2 mm calculus in the mid left kidney. On the right, there are two distal ureteral calculi, edge a sent to each other. The larger of these calculi is immediately proximal to the ureterovesical junction measuring 4 x 3 mm. The smaller calculus measures 2 x 2 mm which is immediately superior to the larger calculus. No other ureteral calculi are evident. Urinary bladder is midline with wall thickness within normal limits.  Stomach/Bowel: There are scattered sigmoid diverticula without diverticulitis. There is moderate stool in the colon. There is no appreciable bowel wall or  mesenteric thickening. There is no evident bowel obstruction. The terminal ileum appears unremarkable. There is no evident free air or portal venous air.  Vascular/Lymphatic: There is no abdominal aortic aneurysm. No vascular  lesions are evident on this noncontrast enhanced study. There is no adenopathy by size criteria in the abdomen or pelvis. There are scattered subcentimeter mesenteric and inguinal lymph nodes.  Reproductive: Uterus is absent.  There is no evident pelvic mass.  Other: Appendix is absent. There is no periappendiceal region inflammation. There is no abscess or ascites in the abdomen or pelvis. There is mild fat in the umbilicus.  Musculoskeletal: No blastic or lytic bone lesions are evident. There is no intramuscular lesion.  IMPRESSION: 1. There are two distal right ureteral calculi measuring 4 x 3 and 2 x 2 mm respectively. There is slight hydronephrosis on the right.  2.  There are nonobstructing calculi in each kidney.  3. Prominent liver and prominent spleen. No focal liver or splenic lesions evident. There is hepatic steatosis.  4. No bowel obstruction. Occasional sigmoid diverticula without diverticulitis. No abscess in the abdomen or pelvis. Appendix is absent. No periappendiceal region inflammation evident.  5.  Uterus absent.  These results will be called to the ordering clinician or representative by the Radiologist Assistant, and communication documented in the PACS or zVision Dashboard.   Electronically Signed   By: Lowella Grip III M.D.   On: 08/16/2019 14:58 I have independently reviewed the films with the patient and pointed out her stones.   Assessment & Plan:    1. Right ureteral stone Explained to the patient that since their stone is  ?10 mm, it is in within AUA Guidelines to continue MET with tamsulosin and pushing fluids for 4 to 6 weeks  She would like to continue MET over the weekend, so she will continue the  tamsulosin but increase it to twice daily Sent a prescription for Toradol 71m 1 p.o. every 6 hours #20 Refilled her Percocet 10/325 She will call office on Monday to discuss what her next step may be as she may consider ureteroscopy in the future and she is made aware that a ureteral stent will need to be placed if she decides to go in that direction as she has tolerated stents poorly in the past  2. Hydronephrosis Will obtain a RUS once the stones have passed  3. Gross hematuria Likely due to migrating stone Will continue to monitor  4. Bilateral nephrolithiasis Underwent left URS and right ES WL earlier this year Stone composition is calcium oxalate    Return for patient to call .  These notes generated with voice recognition software. I apologize for typographical errors.  SZara Council PA-C  [facility] [address] [place] [phone]

## 2019-08-16 NOTE — Telephone Encounter (Signed)
Pt called and states that she called to schedule her [place] and she was told that it would be the end of next week before she could get it. She asked if it could be changed to STAT, she states that she is in a lot of pain.

## 2019-08-17 ENCOUNTER — Encounter: Payer: Self-pay | Admitting: Urology

## 2019-08-17 ENCOUNTER — Ambulatory Visit (INDEPENDENT_AMBULATORY_CARE_PROVIDER_SITE_OTHER): Payer: BC Managed Care – PPO | Admitting: Urology

## 2019-08-17 ENCOUNTER — Telehealth: Payer: Self-pay | Admitting: Family Medicine

## 2019-08-17 VITALS — BP 120/83 | HR 67 | Ht 63.0 in | Wt 233.6 lb

## 2019-08-17 DIAGNOSIS — N201 Calculus of ureter: Secondary | ICD-10-CM | POA: Diagnosis not present

## 2019-08-17 DIAGNOSIS — N132 Hydronephrosis with renal and ureteral calculous obstruction: Secondary | ICD-10-CM | POA: Diagnosis not present

## 2019-08-17 DIAGNOSIS — N200 Calculus of kidney: Secondary | ICD-10-CM

## 2019-08-17 DIAGNOSIS — R109 Unspecified abdominal pain: Secondary | ICD-10-CM

## 2019-08-17 DIAGNOSIS — R310 Gross hematuria: Secondary | ICD-10-CM

## 2019-08-17 MED ORDER — OXYCODONE-ACETAMINOPHEN 10-325 MG PO TABS: 1.0000 | ORAL_TABLET | ORAL | 0 refills | Status: DC | PRN

## 2019-08-17 MED ORDER — KETOROLAC TROMETHAMINE 10 MG PO TABS: 10.0000 mg | ORAL_TABLET | Freq: Four times a day (QID) | ORAL | 0 refills | Status: DC | PRN

## 2019-08-17 NOTE — Telephone Encounter (Signed)
-----   Message from Nori Riis, PA-C sent at 08/16/2019  3:21 PM EST ----- Would you call [place] and let her know that the CT showed two small stones trying to pass?  If she needs more medication for pain, I can send a prescription.  If she wants to have surgery, I can see her tomorrow at 11 to set her up for next week.

## 2019-08-17 NOTE — Telephone Encounter (Signed)
Spoke to patient and appointment was made to have OV.

## 2019-08-20 ENCOUNTER — Other Ambulatory Visit: Payer: Self-pay | Admitting: Urology

## 2019-08-20 ENCOUNTER — Other Ambulatory Visit: Payer: Self-pay | Admitting: Radiology

## 2019-08-20 ENCOUNTER — Telehealth: Payer: Self-pay | Admitting: Urology

## 2019-08-20 DIAGNOSIS — N201 Calculus of ureter: Secondary | ICD-10-CM

## 2019-08-20 NOTE — Telephone Encounter (Signed)
Taylor Mercer would like to be scheduled for a right ureteroscopy with laser lithotripsy and stent placement for her right distal stone.

## 2019-08-20 NOTE — Telephone Encounter (Signed)
Pt called and states that she has still not passed her kidney stones. She states that she is having a little bit of pain, but nothing else.

## 2019-08-21 ENCOUNTER — Other Ambulatory Visit: Payer: Self-pay

## 2019-08-21 DIAGNOSIS — N201 Calculus of ureter: Secondary | ICD-10-CM

## 2019-08-22 ENCOUNTER — Other Ambulatory Visit: Payer: Self-pay | Admitting: *Deleted

## 2019-08-22 ENCOUNTER — Other Ambulatory Visit: Payer: BC Managed Care – PPO

## 2019-08-22 DIAGNOSIS — N201 Calculus of ureter: Secondary | ICD-10-CM

## 2019-08-22 NOTE — Telephone Encounter (Signed)
Pt called and wants to make sure we got her MyChart message

## 2019-08-24 ENCOUNTER — Other Ambulatory Visit: Payer: BC Managed Care – PPO

## 2019-08-28 ENCOUNTER — Other Ambulatory Visit: Payer: BC Managed Care – PPO

## 2019-08-30 ENCOUNTER — Ambulatory Visit: Payer: BC Managed Care – PPO | Admitting: Urology

## 2019-08-31 ENCOUNTER — Ambulatory Visit: Admit: 2019-08-31 | Payer: BC Managed Care – PPO | Admitting: Urology

## 2019-08-31 SURGERY — CYSTOSCOPY/URETEROSCOPY/HOLMIUM LASER/STENT PLACEMENT
Anesthesia: Choice | Laterality: Right

## 2019-09-26 ENCOUNTER — Other Ambulatory Visit: Payer: Self-pay

## 2019-09-26 ENCOUNTER — Ambulatory Visit
Admission: RE | Admit: 2019-09-26 | Discharge: 2019-09-26 | Disposition: A | Discharge status: Home / Self Care | Payer: BC Managed Care – PPO | Source: Ambulatory Visit | Attending: Urology | Admitting: Urology

## 2019-09-26 DIAGNOSIS — N201 Calculus of ureter: Secondary | ICD-10-CM | POA: Insufficient documentation

## 2019-10-02 ENCOUNTER — Telehealth: Payer: BC Managed Care – PPO | Admitting: Urology

## 2019-10-30 ENCOUNTER — Other Ambulatory Visit: Payer: Self-pay | Admitting: Urology

## 2019-10-30 DIAGNOSIS — R39198 Other difficulties with micturition: Secondary | ICD-10-CM

## 2019-10-30 NOTE — Telephone Encounter (Signed)
Does she need to continue? 

## 2019-12-21 ENCOUNTER — Other Ambulatory Visit: Payer: Self-pay | Admitting: Family Medicine

## 2019-12-21 DIAGNOSIS — N6320 Unspecified lump in the left breast, unspecified quadrant: Secondary | ICD-10-CM

## 2020-01-21 ENCOUNTER — Other Ambulatory Visit: Payer: Self-pay | Admitting: Family Medicine

## 2020-01-21 DIAGNOSIS — N644 Mastodynia: Secondary | ICD-10-CM

## 2020-01-21 DIAGNOSIS — N6320 Unspecified lump in the left breast, unspecified quadrant: Secondary | ICD-10-CM

## 2020-02-03 ENCOUNTER — Other Ambulatory Visit: Payer: Self-pay

## 2020-02-03 ENCOUNTER — Encounter: Payer: Self-pay | Admitting: Emergency Medicine

## 2020-02-03 ENCOUNTER — Ambulatory Visit
Admission: EM | Admit: 2020-02-03 | Discharge: 2020-02-03 | Disposition: A | Discharge status: Home / Self Care | Payer: BC Managed Care – PPO | Attending: Emergency Medicine | Admitting: Emergency Medicine

## 2020-02-03 DIAGNOSIS — L0291 Cutaneous abscess, unspecified: Secondary | ICD-10-CM | POA: Diagnosis not present

## 2020-02-03 MED ORDER — DOXYCYCLINE HYCLATE 100 MG PO CAPS: 100.0000 mg | ORAL_CAPSULE | Freq: Two times a day (BID) | ORAL | 0 refills | Status: DC

## 2020-02-03 MED ORDER — MUPIROCIN 2 % EX OINT: 1.0000 "application " | TOPICAL_OINTMENT | Freq: Three times a day (TID) | CUTANEOUS | 0 refills | Status: DC

## 2020-02-03 NOTE — Discharge Instructions (Addendum)
Apply cool compresses to the eyelid 3-4 times daily for 10 to 15 minutes each time.  Use the mupirocin 3x daily on the red area itself being very careful not to get it in your eye.  Take all of the oral antibiotics prescribed.  If you do not notice improvement in 2 days follow-up with our clinic or go to [facility].

## 2020-02-03 NOTE — ED Provider Notes (Signed)
[facility]    CSN: [id number] Arrival date & time: 02/03/20  [id number]      History   Chief Complaint Chief Complaint  Patient presents with  . Abscess    left eyebrow    HPI Taylor Mercer is a 34 y.o. female.   HPI  34 year old female presents with an itchy red bump on her left eyelid started yesterday.  She thought at first it may be an insect bite.  She also has her eyebrows" flossed" but has not had this performed in week or 2.  Noticed that the swelling of her eyelid is more pronounced today.  She does not specifically C/O of pain unless it has pressure applied to it.  She has itching on the medial canthus of the left eye.  Has had no visual disturbances.       Past Medical History:  Diagnosis Date  . Anemia    WITH PREGNANCY  . Complication of anesthesia   . Endometriosis   . Family history of adverse reaction to anesthesia    DAD-N/V  . GERD (gastroesophageal reflux disease)    OCC  . Heart murmur    AS A CHILD-ASYMPTOMATIC  . Hypertension   . Kidney stones   . Kidney stones   . Migraine    MIGRAINES  . PONV (postoperative nausea and vomiting)     Patient Active Problem List   Diagnosis Date Noted  . Kidney stones 08/08/2019  . Left wrist pain 07/30/2019  . Allergic rhinitis 09/09/2017  . Fever 08/01/2017  . Mono exposure 08/01/2017  . Other fatigue 08/01/2017  . Dysfunction of right eustachian tube 02/25/2017  . Psoriasis 01/03/2017  . Lower abdominal pain 06/18/2016  . Lymphadenopathy 02/23/2016  . Adjustment disorder with anxious mood 12/18/2015  . Acute recurrent maxillary sinusitis 10/20/2015  . Morbid (severe) obesity due to excess calories (HCC) 10/20/2015  . Right upper quadrant abdominal pain 10/20/2015  . Skin tag 10/20/2015  . Lumbar spine strain 02/07/2014  . Pelvic and perineal pain 02/28/2013  . Migraine, unspecified, not intractable, without status migrainosus 12/25/2012  . High blood pressure 10/12/2011     Past Surgical History:  Procedure Laterality Date  . ABDOMINAL HYSTERECTOMY    . APPENDECTOMY    . CESAREAN SECTION     X2  . CYST EXCISION    . CYSTOSCOPY/URETEROSCOPY/HOLMIUM LASER/STENT PLACEMENT Left 03/08/2019   Procedure: CYSTOSCOPY/URETEROSCOPY/HOLMIUM LASER/STENT PLACEMENT;  Surgeon: Sondra Come, MD;  Location: [facility] OR[facility];  Service: Urology;  Laterality: Left;  . EXTRACORPOREAL SHOCK WAVE LITHOTRIPSY Right 05/17/2019   Procedure: EXTRACORPOREAL SHOCK WAVE LITHOTRIPSY (ESWL);  Surgeon: Sondra Come, MD;  Location: [facility] OR[facility];  Service: Urology;  Laterality: Right;    OB History   No obstetric history on file.      Home Medications    Prior to Admission medications   Medication Sig Start Date End Date Taking? Authorizing Provider  cetirizine (ZYRTEC) 10 MG tablet Take 10 mg by mouth daily as needed for allergies.  08/16/19  Yes [provider]  chlorthalidone (HYGROTON) 25 MG tablet Take 1 tablet (25 mg total) by mouth daily. Patient taking differently: Take 25 mg by mouth at bedtime.  07/16/15  Yes Jeanmarie Plant, MD  fluticasone (FLONASE) 50 MCG/ACT nasal spray Place 1 spray into both nostrils daily as needed for allergies.  08/04/19  Yes [provider]  ibuprofen (ADVIL) 800 MG tablet Take 800 mg by mouth every 8 (eight) hours as needed for  moderate pain.  07/27/19  Yes [provider]  omeprazole (PRILOSEC) 20 MG capsule Take 20 mg by mouth daily. 07/31/19  Yes [provider]  propranolol (INDERAL) 10 MG tablet Take 10 mg by mouth 2 (two) times a day. 02/18/19  Yes [provider]  SUMAtriptan (IMITREX) 50 MG tablet Take 50 mg by mouth every 2 (two) hours as needed for migraine or headache.    Yes [provider]  tiZANidine (ZANAFLEX) 4 MG tablet Take 4 mg by mouth every 8 (eight) hours as needed for muscle spasms.  08/16/19 08/15/20 Yes [provider]  azelastine (ASTELIN) 0.1 % nasal spray Place 1 spray  into both nostrils 2 (two) times daily.  08/16/19 08/15/20  [provider]  doxycycline (VIBRAMYCIN) 100 MG capsule Take 1 capsule (100 mg total) by mouth 2 (two) times daily. 02/03/20   Lutricia Feil, PA-C  mupirocin ointment (BACTROBAN) 2 % Apply 1 application topically 3 (three) times daily. 02/03/20   Lutricia Feil, PA-C  ondansetron (ZOFRAN-ODT) 4 MG disintegrating tablet Take 4 mg by mouth every 8 (eight) hours as needed for nausea or vomiting.  07/29/19   [provider]    Family History Family History  Problem Relation Age of Onset  . Hypertension Mother   . Cancer Father     Social History Social History   Tobacco Use  . Smoking status: Never Smoker  . Smokeless tobacco: Never Used  Substance Use Topics  . Alcohol use: No  . Drug use: No     Allergies   Patient has no known allergies.   Review of Systems Review of Systems  Constitutional: Positive for activity change. Negative for appetite change, chills, diaphoresis, fatigue and fever.  Eyes: Positive for redness and itching. Negative for photophobia, pain, discharge and visual disturbance.  Skin: Positive for color change and rash.  All other systems reviewed and are negative.    Physical Exam Triage Vital Signs ED Triage Vitals  Enc Vitals Group     BP 02/03/20 0929 119/85     Pulse Rate 02/03/20 0929 70     Resp 02/03/20 0929 14     Temp 02/03/20 0929 98 F (36.7 C)     Temp Source 02/03/20 0929 Oral     SpO2 02/03/20 0929 99 %     Weight 02/03/20 0926 230 lb (104.3 kg)     Height 02/03/20 0926 5\' 3"  (1.6 m)     Head Circumference --      Peak Flow --      Pain Score 02/03/20 0926 6     Pain Loc --      Pain Edu? --      Excl. in GC? --    No data found.  Updated Vital Signs BP 119/85 (BP Location: Left Arm)   Pulse 70   Temp 98 F (36.7 C) (Oral)   Resp 14   Ht 5\' 3"  (1.6 m)   Wt 230 lb (104.3 kg)   SpO2 99%   BMI 40.74 kg/m   Visual Acuity Right Eye  Distance:   Left Eye Distance:   Bilateral Distance:    Right Eye Near:   Left Eye Near:    Bilateral Near:     Physical Exam Vitals and nursing note reviewed.  Constitutional:      General: She is not in acute distress.    Appearance: Normal appearance. She is not ill-appearing or toxic-appearing.  HENT:  Head: Normocephalic and atraumatic.     Nose: Nose normal.  Eyes:     General:        Right eye: No discharge.        Left eye: No discharge.     Extraocular Movements: Extraocular movements intact.     Conjunctiva/sclera: Conjunctivae normal.     Pupils: Pupils are equal, round, and reactive to light.     Comments: Lamination of the left shows prominent swelling of the left eyelid.  There is a small indurated nonfluctuant red bump over the lateral portion of the eyebrow.  The eyelid puffiness is very soft.  She has full EOM.  PERRLA.  Refer to photographs for details.  Musculoskeletal:        General: Normal range of motion.  Skin:    General: Skin is warm and dry.     Findings: Erythema present.  Neurological:     General: No focal deficit present.     Mental Status: She is alert and oriented to person, place, and time.  Psychiatric:        Mood and Affect: Mood normal.        Behavior: Behavior normal.        Thought Content: Thought content normal.        Judgment: Judgment normal.            UC Treatments / Results  Labs (all labs ordered are listed, but only abnormal results are displayed) Labs Reviewed - No data to display  EKG   Radiology No results found.  Procedures Procedures (including critical care time)  Medications Ordered in UC Medications - No data to display  Initial Impression / Assessment and Plan / UC Course  I have reviewed the triage vital signs and the nursing notes.  Pertinent labs & imaging results that were available during my care of the patient were reviewed by me and considered in my medical decision making (see  chart for details).   34 year old female presents with sling and a red lesion on the lateral aspect of the upper left eyelid.  She noticed it yesterday and has increased in swelling overnight.  She also has itching of the medial canthus area.  It is tender to the touch.  It is nonfluctuant but indurated.  Refer to photographs for details.  The patient has an small abscess over the eyelid.  She has swelling of the eyelid and also of the lower lid is somewhat puffy.  EOMs are full.  PERRLA.  I have told her to apply warm compresses to help with the swelling.  She will apply mupirocin ointment to the area of redness being very careful not to have the repose and get in her eye.  I will have her take doxycycline for 5 days p.o.  She was cautioned that if she is not better in 2 days she should follow-up with [facility].  He also return to our clinic.   Final Clinical Impressions(s) / UC Diagnoses   Final diagnoses:  Abscess     Discharge Instructions     Apply cool compresses to the eyelid 3-4 times daily for 10 to 15 minutes each time.  Use the mupirocin 3x daily on the red area itself being very careful not to get it in your eye.  Take all of the oral antibiotics prescribed.  If you do not notice improvement in 2 days follow-up with our clinic or go to  eye.    ED Prescriptions  Medication Sig Dispense Auth. Provider   doxycycline (VIBRAMYCIN) 100 MG capsule Take 1 capsule (100 mg total) by mouth 2 (two) times daily. 10 capsule Crecencio Mc P, PA-C   mupirocin ointment (BACTROBAN) 2 % Apply 1 application topically 3 (three) times daily. 22 g Lorin Picket, PA-C     PDMP not reviewed this encounter.   Lorin Picket, PA-C 02/03/20 1017

## 2020-02-03 NOTE — ED Triage Notes (Signed)
Patient c/o red itchy bump on her left eyebrow that started yesterday.  Patient not sure if it is an insect bite.  Patient denies fevers.

## 2020-02-19 ENCOUNTER — Ambulatory Visit
Admission: EM | Admit: 2020-02-19 | Discharge: 2020-02-19 | Disposition: A | Discharge status: Home / Self Care | Payer: BC Managed Care – PPO | Attending: Emergency Medicine | Admitting: Emergency Medicine

## 2020-02-19 ENCOUNTER — Ambulatory Visit (INDEPENDENT_AMBULATORY_CARE_PROVIDER_SITE_OTHER): Payer: BC Managed Care – PPO

## 2020-02-19 ENCOUNTER — Other Ambulatory Visit: Payer: Self-pay

## 2020-02-19 ENCOUNTER — Encounter: Payer: Self-pay | Admitting: Emergency Medicine

## 2020-02-19 DIAGNOSIS — M542 Cervicalgia: Secondary | ICD-10-CM | POA: Diagnosis not present

## 2020-02-19 NOTE — Discharge Instructions (Addendum)
Your x-ray was negative for any air underneath your skin.  I think that you were sore from the jaw thrusting.  Cool compresses to your neck, take at 1000 mg of Tylenol 3-4 times a day as needed for pain.

## 2020-02-19 NOTE — ED Provider Notes (Signed)
HPI  SUBJECTIVE:  Taylor Mercer is a 34 y.o. female who presents with bilateral neck pain waking her up at 3 AM last night.  She denies sore throat, cervical adenopathy, neck swelling, neck stiffness, fevers, voice changes, difficulty breathing, foreign body sensation, sensation of throat swelling shut, trismus, stridor, chest pain, shortness of breath, cough.  She describes the pain as constant, achy soreness worse on the left more so than the right.  It is not getting any worse.  She is able to swallow without any problem.  She tried 1000 mg of Tylenol twice without improvement in her symptoms.  Symptoms worse with palpation.  No swelling underneath the jaw, facial swelling.  Patient had an upper GI endoscopy with gastric biopsies done yesterday.  Is not allowed to take NSAIDs at this time because of the gastric biopsy.  She has had EGDs before without any complications.  She has a past medical history of GERD, hypertension.  No history of diabetes.  LMP: Hysterectomy.  PMD: Dr. Melyn[facility].  GI, Dr. Denice Paradise at [facility]  Past Medical History:  Diagnosis Date  . Anemia    WITH PREGNANCY  . Complication of anesthesia   . Endometriosis   . Family history of adverse reaction to anesthesia    DAD-N/V  . GERD (gastroesophageal reflux disease)    OCC  . Heart murmur    AS A CHILD-ASYMPTOMATIC  . Hypertension   . Kidney stones   . Kidney stones   . Migraine    MIGRAINES  . PONV (postoperative nausea and vomiting)     Past Surgical History:  Procedure Laterality Date  . ABDOMINAL HYSTERECTOMY    . APPENDECTOMY    . CESAREAN SECTION     X2  . CYST EXCISION    . CYSTOSCOPY/URETEROSCOPY/HOLMIUM LASER/STENT PLACEMENT Left 03/08/2019   Procedure: CYSTOSCOPY/URETEROSCOPY/HOLMIUM LASER/STENT PLACEMENT;  Surgeon: Billey Co, MD;  Location: [facility] ORS;  Service: Urology;  Laterality: Left;  . EXTRACORPOREAL SHOCK WAVE LITHOTRIPSY Right 05/17/2019   Procedure:  EXTRACORPOREAL SHOCK WAVE LITHOTRIPSY (ESWL);  Surgeon: Billey Co, MD;  Location: [facility] ORS;  Service: Urology;  Laterality: Right;    Family History  Problem Relation Age of Onset  . Hypertension Mother   . Cancer Father     Social History   Tobacco Use  . Smoking status: Never Smoker  . Smokeless tobacco: Never Used  Substance Use Topics  . Alcohol use: No  . Drug use: No    No current facility-administered medications for this encounter.  Current Outpatient Medications:  .  azelastine (ASTELIN) 0.1 % nasal spray, Place 1 spray into both nostrils 2 (two) times daily. , Disp: , Rfl:  .  cetirizine (ZYRTEC) 10 MG tablet, Take 10 mg by mouth daily as needed for allergies. , Disp: , Rfl:  .  chlorthalidone (HYGROTON) 25 MG tablet, Take 1 tablet (25 mg total) by mouth daily. (Patient taking differently: Take 25 mg by mouth at bedtime. ), Disp: 15 tablet, Rfl: 0 .  fluticasone (FLONASE) 50 MCG/ACT nasal spray, Place 1 spray into both nostrils daily as needed for allergies. , Disp: , Rfl:  .  omeprazole (PRILOSEC) 20 MG capsule, Take 20 mg by mouth daily., Disp: , Rfl:  .  ondansetron (ZOFRAN-ODT) 4 MG disintegrating tablet, Take 4 mg by mouth every 8 (eight) hours as needed for nausea or vomiting. , Disp: , Rfl:  .  propranolol (INDERAL) 10 MG tablet, Take 10 mg by mouth  2 (two) times a day., Disp: , Rfl:  .  SUMAtriptan (IMITREX) 50 MG tablet, Take 50 mg by mouth every 2 (two) hours as needed for migraine or headache. , Disp: , Rfl:  .  tiZANidine (ZANAFLEX) 4 MG tablet, Take 4 mg by mouth every 8 (eight) hours as needed for muscle spasms. , Disp: , Rfl:   No Known Allergies   ROS  As noted in HPI.   Physical Exam  BP 122/78 (BP Location: Left Arm)   Pulse 87   Temp 98.2 F (36.8 C) (Oral)   Resp 18   Ht 5\' 3"  (1.6 m)   Wt 104.3 kg   SpO2 100%   BMI 40.74 kg/m   Constitutional: Well developed, well nourished, no acute distress Eyes:  EOMI, conjunctiva normal  bilaterally HENT: Normocephalic, atraumatic,mucus membranes moist.  Airway widely patent.  No drooling, trismus, stridor.  Normal voice. Neck: No tenderness over the sternocleidomastoid.  Positive tenderness inferior to the angle of the jaw bilaterally worse on the left.  No appreciable anterior, posterior cervical lymphadenopathy.  No appreciable subcutaneous crepitus over the entire neck.  No neck stiffness, limitation of motion. Respiratory: Normal inspiratory effort, lungs clear bilaterally Cardiovascular: Normal rate GI: nondistended skin: No rash, skin intact Musculoskeletal: no deformities Neurologic: Alert & oriented x 3, no focal neuro deficits Psychiatric: Speech and behavior appropriate   ED Course   Medications - No data to display  Orders Placed This Encounter  Procedures  . DG Neck Soft Tissue    Standing Status:   Standing    Number of Occurrences:   1    Order Specific Question:   Reason for Exam (SYMPTOM  OR DIAGNOSIS REQUIRED)    Answer:   s/p EGD yesterday bilateral neck pain r/o subcu air    No results found for this or any previous visit (from the past 24 hour(s)). DG Neck Soft Tissue  Result Date: 02/19/2020 CLINICAL DATA:  Pain after recent endoscopy EXAM: NECK SOFT TISSUES - 1+ VIEW COMPARISON:  None FINDINGS: Frontal and lateral views were obtained. Epiglottis and aryepiglottic folds appear normal. Prevertebral soft tissues are normal. No air-fluid level to suggest abscess. Tongue and tongue base regions appear unremarkable. No subcutaneous air or pneumomediastinum evident. No pneumothorax in the visualized upper lung regions. Visualized upper lungs are clear. Bony structures appear unremarkable. IMPRESSION: No abnormality noted. Electronically Signed   By: 04/20/2020 III M.D.   On: 02/19/2020 12:26    ED Clinical Impression  1. Neck pain      ED Assessment/Plan  Outside records reviewed.  As noted in HPI.  Discussed with 04/20/2020, charge nurse at  the [facility] GI procedure center.  She then discussed case with [facility]. [facility], apparently the procedure was short, there were no complications with the procedure.  They did do some jaw thrusting which could cause some soreness in this area.  Will check soft tissue neck to rule out any subcutaneous air.  Reviewed imaging independently, and discussed with Dr. Luan Pulling, radiology.  No subcutaneous air.  See radiology report for full details.  Suspect pain from jaw thrusting.  No evidence of esophageal perforation.  Home with supportive treatment.  Cool compresses, Tylenol 1000 mg 3-4 times a day as needed for pain.  No NSAIDs because of the gastric biopsies.  Patient declined prescription of Norco.  Follow-up with PMD as needed, to the ER if she gets worse, any signs of airway compromise.  Discussed  imaging, MDM, treatment plan,  and plan for follow-up with patient. Discussed sn/sx that should prompt return to the ED. patient agrees with plan.   No orders of the defined types were placed in this encounter.   *This clinic note was created using Dragon dictation software. Therefore, there may be occasional mistakes despite careful proofreading.   ?    Domenick Gong, MD 02/20/20 [phone]

## 2020-02-19 NOTE — ED Triage Notes (Signed)
Patient in today c/o bilateral neck swelling and pain since ~3am this morning. Patient had an EGD done yesterday. Patient called GI and was told to contact her primary. Her primary couldn't see her until 6pm tonight. Patient denies fever or sore throat.

## 2020-03-21 ENCOUNTER — Encounter: Payer: Self-pay | Admitting: Urology

## 2020-06-02 ENCOUNTER — Ambulatory Visit: Payer: BC Managed Care – PPO | Admitting: Urology

## 2020-06-05 ENCOUNTER — Ambulatory Visit
Admission: RE | Admit: 2020-06-05 | Discharge: 2020-06-05 | Disposition: A | Discharge status: Home / Self Care | Payer: BC Managed Care – PPO | Source: Ambulatory Visit | Attending: Urology | Admitting: Urology

## 2020-06-05 ENCOUNTER — Ambulatory Visit: Payer: BC Managed Care – PPO | Admitting: Urology

## 2020-06-05 ENCOUNTER — Encounter: Payer: Self-pay | Admitting: Urology

## 2020-06-05 ENCOUNTER — Other Ambulatory Visit: Payer: Self-pay

## 2020-06-05 ENCOUNTER — Ambulatory Visit
Admission: RE | Admit: 2020-06-05 | Discharge: 2020-06-05 | Disposition: A | Discharge status: Home / Self Care | Payer: BC Managed Care – PPO | Attending: Urology | Admitting: Urology

## 2020-06-05 ENCOUNTER — Ambulatory Visit (INDEPENDENT_AMBULATORY_CARE_PROVIDER_SITE_OTHER): Payer: BC Managed Care – PPO | Admitting: Urology

## 2020-06-05 VITALS — BP 122/81 | HR 69 | Ht 63.0 in | Wt 225.0 lb

## 2020-06-05 DIAGNOSIS — N201 Calculus of ureter: Secondary | ICD-10-CM | POA: Insufficient documentation

## 2020-06-05 DIAGNOSIS — N200 Calculus of kidney: Secondary | ICD-10-CM

## 2020-06-05 NOTE — Patient Instructions (Signed)
Dietary Guidelines to Help Prevent Kidney Stones Kidney stones are deposits of minerals and salts that form inside your kidneys. Your risk of developing kidney stones may be greater depending on your diet, your lifestyle, the medicines you take, and whether you have certain medical conditions. Most people can reduce their chances of developing kidney stones by following the instructions below. Depending on your overall health and the type of kidney stones you tend to develop, your dietitian may give you more specific instructions. What are tips for following this plan? Reading food labels  Choose foods with "no salt added" or "low-salt" labels. Limit your sodium intake to less than 1500 mg per day.  Choose foods with calcium for each meal and snack. Try to eat about 300 mg of calcium at each meal. Foods that contain 200-500 mg of calcium per serving include: ? 8 oz (237 ml) of milk, fortified nondairy milk, and fortified fruit juice. ? 8 oz (237 ml) of kefir, yogurt, and soy yogurt. ? 4 oz (118 ml) of tofu. ? 1 oz of cheese. ? 1 cup (300 g) of dried figs. ? 1 cup (91 g) of cooked broccoli. ? 1-3 oz can of sardines or mackerel.  Most people need 1000 to 1500 mg of calcium each day. Talk to your dietitian about how much calcium is recommended for you. Shopping  Buy plenty of fresh fruits and vegetables. Most people do not need to avoid fruits and vegetables, even if they contain nutrients that may contribute to kidney stones.  When shopping for convenience foods, choose: ? Whole pieces of fruit. ? Premade salads with dressing on the side. ? Low-fat fruit and yogurt smoothies.  Avoid buying frozen meals or prepared deli foods.  Look for foods with live cultures, such as yogurt and kefir. Cooking  Do not add salt to food when cooking. Place a salt shaker on the table and allow each person to add his or her own salt to taste.  Use vegetable protein, such as beans, textured vegetable  protein (TVP), or tofu instead of meat in pasta, casseroles, and soups. Meal planning   Eat less salt, if told by your dietitian. To do this: ? Avoid eating processed or premade food. ? Avoid eating fast food.  Eat less animal protein, including cheese, meat, poultry, or fish, if told by your dietitian. To do this: ? Limit the number of times you have meat, poultry, fish, or cheese each week. Eat a diet free of meat at least 2 days a week. ? Eat only one serving each day of meat, poultry, fish, or seafood. ? When you prepare animal protein, cut pieces into small portion sizes. For most meat and fish, one serving is about the size of one deck of cards.  Eat at least 5 servings of fresh fruits and vegetables each day. To do this: ? Keep fruits and vegetables on hand for snacks. ? Eat 1 piece of fruit or a handful of berries with breakfast. ? Have a salad and fruit at lunch. ? Have two kinds of vegetables at dinner.  Limit foods that are high in a substance called oxalate. These include: ? Spinach. ? Rhubarb. ? Beets. ? Potato chips and french fries. ? Nuts.  If you regularly take a diuretic medicine, make sure to eat at least 1-2 fruits or vegetables high in potassium each day. These include: ? Avocado. ? Banana. ? Orange, prune, carrot, or tomato juice. ? Baked potato. ? Cabbage. ? Beans and split   peas. General instructions   Drink enough fluid to keep your urine clear or pale yellow. This is the most important thing you can do.  Talk to your health care provider and dietitian about taking daily supplements. Depending on your health and the cause of your kidney stones, you may be advised: ? Not to take supplements with vitamin C. ? To take a calcium supplement. ? To take a daily probiotic supplement. ? To take other supplements such as magnesium, fish oil, or vitamin B6.  Take all medicines and supplements as told by your health care provider.  Limit alcohol intake to no  more than 1 drink a day for nonpregnant women and 2 drinks a day for men. One drink equals 12 oz of beer, 5 oz of wine, or 1 oz of hard liquor.  Lose weight if told by your health care provider. Work with your dietitian to find strategies and an eating plan that works best for you. What foods are not recommended? Limit your intake of the following foods, or as told by your dietitian. Talk to your dietitian about specific foods you should avoid based on the type of kidney stones and your overall health. Grains Breads. Bagels. Rolls. Baked goods. Salted crackers. Cereal. Pasta. Vegetables Spinach. Rhubarb. Beets. Canned vegetables. Pickles. Olives. Meats and other protein foods Nuts. Nut butters. Large portions of meat, poultry, or fish. Salted or cured meats. Deli meats. Hot dogs. Sausages. Dairy Cheese. Beverages Regular soft drinks. Regular vegetable juice. Seasonings and other foods Seasoning blends with salt. Salad dressings. Canned soups. Soy sauce. Ketchup. Barbecue sauce. Canned pasta sauce. Casseroles. Pizza. Lasagna. Frozen meals. Potato chips. French fries. Summary  You can reduce your risk of kidney stones by making changes to your diet.  The most important thing you can do is drink enough fluid. You should drink enough fluid to keep your urine clear or pale yellow.  Ask your health care provider or dietitian how much protein from animal sources you should eat each day, and also how much salt and calcium you should have each day. This information is not intended to replace advice given to you by your health care provider. Make sure you discuss any questions you have with your health care provider. Document Revised: 12/20/2018 Document Reviewed: 08/10/2016 Elsevier Patient Education  2020 Elsevier Inc.  

## 2020-06-05 NOTE — Progress Notes (Signed)
   06/05/2020 1:11 PM   Taylor Mercer 10-06-1985 [id number]  Reason for visit: Follow up nephrolithiasis  HPI: I saw [name] back in follow-up for nephrolithiasis.  She is a 34 year old female who underwent a shockwave lithotripsy on the right side on 05/31/2019.  She had some small obstructive fragments post procedurally, but these ultimately passed spontaneously and did not require any further procedures.  She denies any stone episodes since that time.  She denies any gross hematuria or flank pain, or UTIs.  I personally reviewed her KUB today that shows some small residual fragments in the lower poles, but no calcifications over the projected course of the ureters.  Stone type was calcium oxalate.  We discussed general stone prevention strategies including adequate hydration with goal of producing 2.5 L of urine daily, increasing citric acid intake, increasing calcium intake during high oxalate meals, minimizing animal protein, and decreasing salt intake. Information about dietary recommendations given today.   RTC 1 year with KUB   Sondra Come, MD  [facility] [address] [place], [place] [address] [phone]

## 2021-05-26 IMAGING — CR ABDOMEN - 1 VIEW
1 series · 3 of 3 positions shown · non-contrast
Comparison: CT abdomen and pelvis March 01, 2019

CLINICAL DATA: Left ureteral calculus

EXAM:
ABDOMEN - 1 VIEW

[Series 1: dg abd 1 view · 0.14mm/px · 3 of 3 slices shown]
[im 1/3]
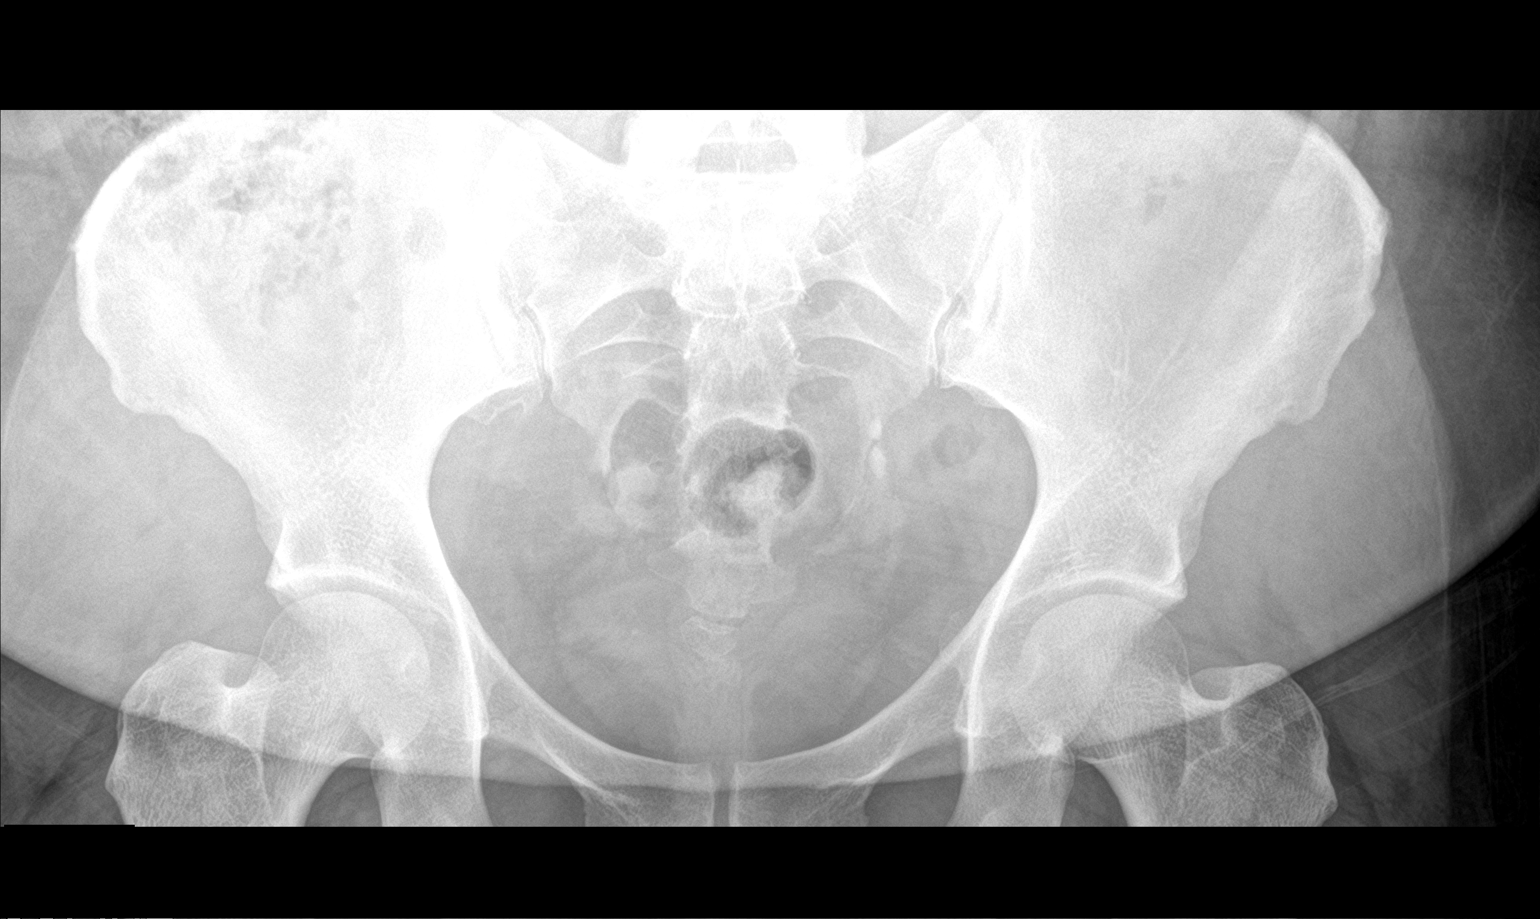
[im 2/3]
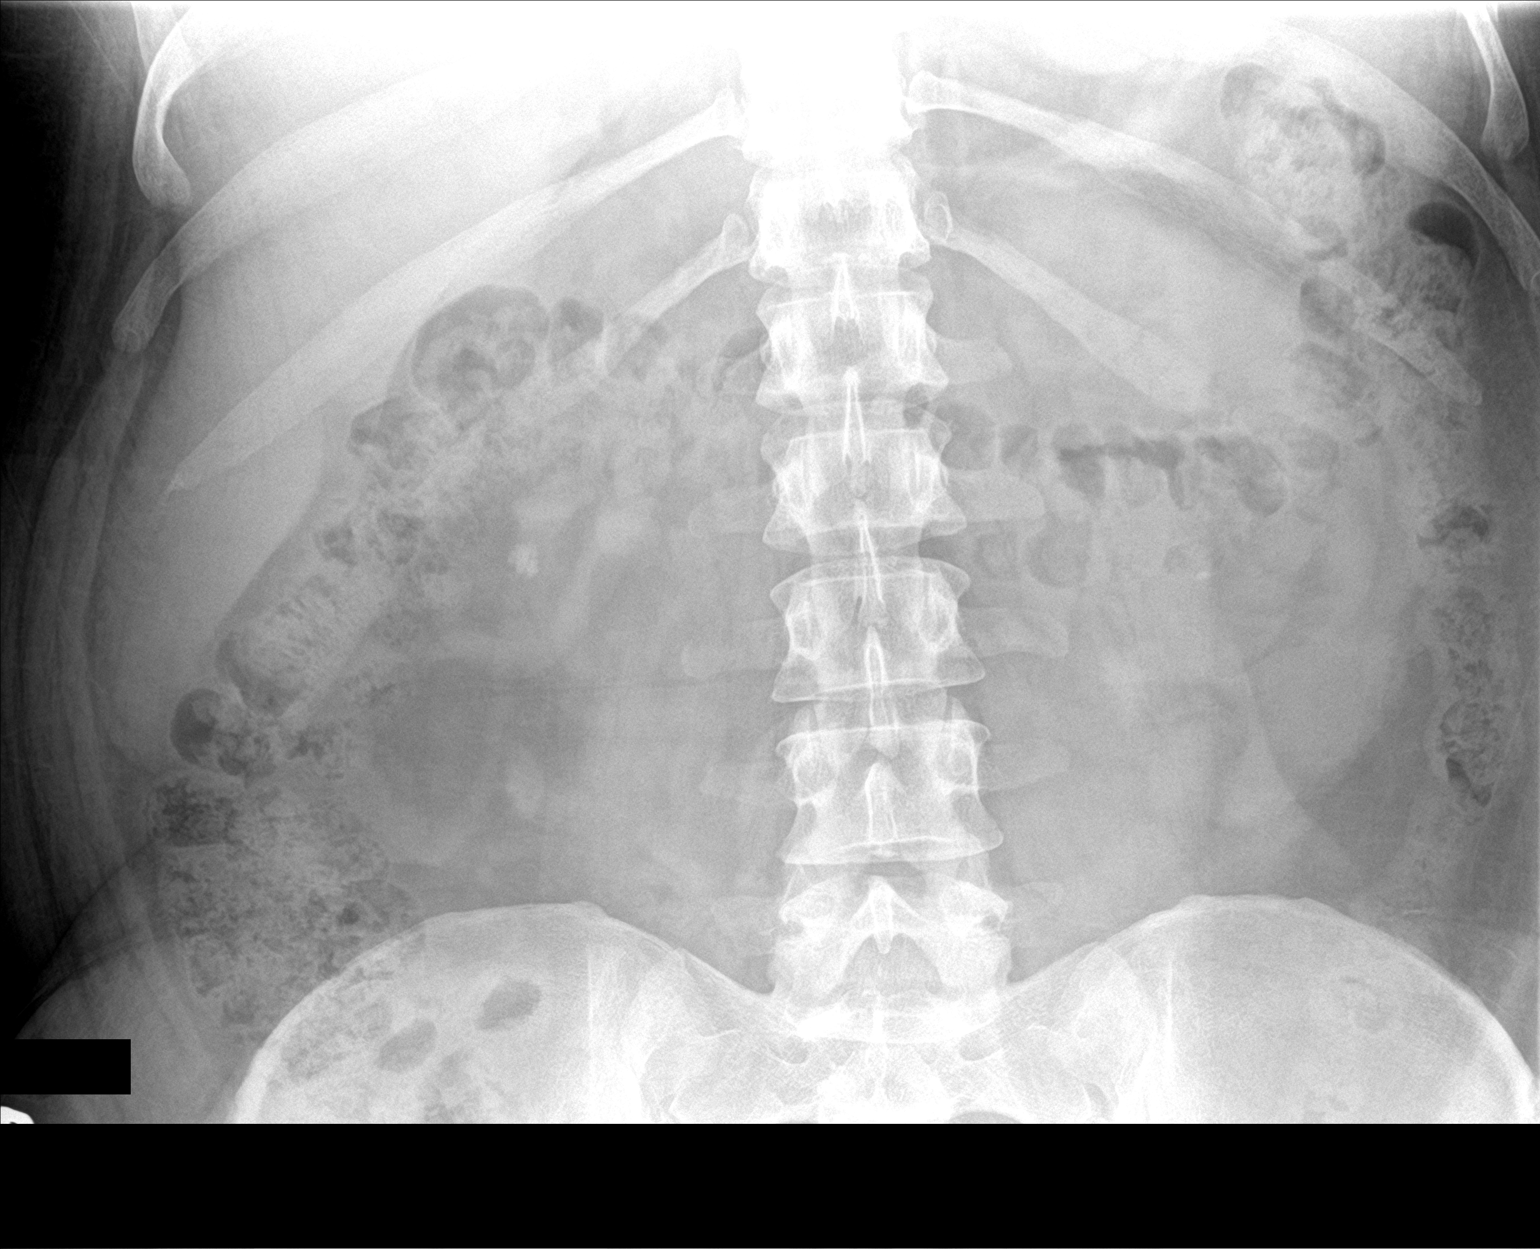
[im 3/3]
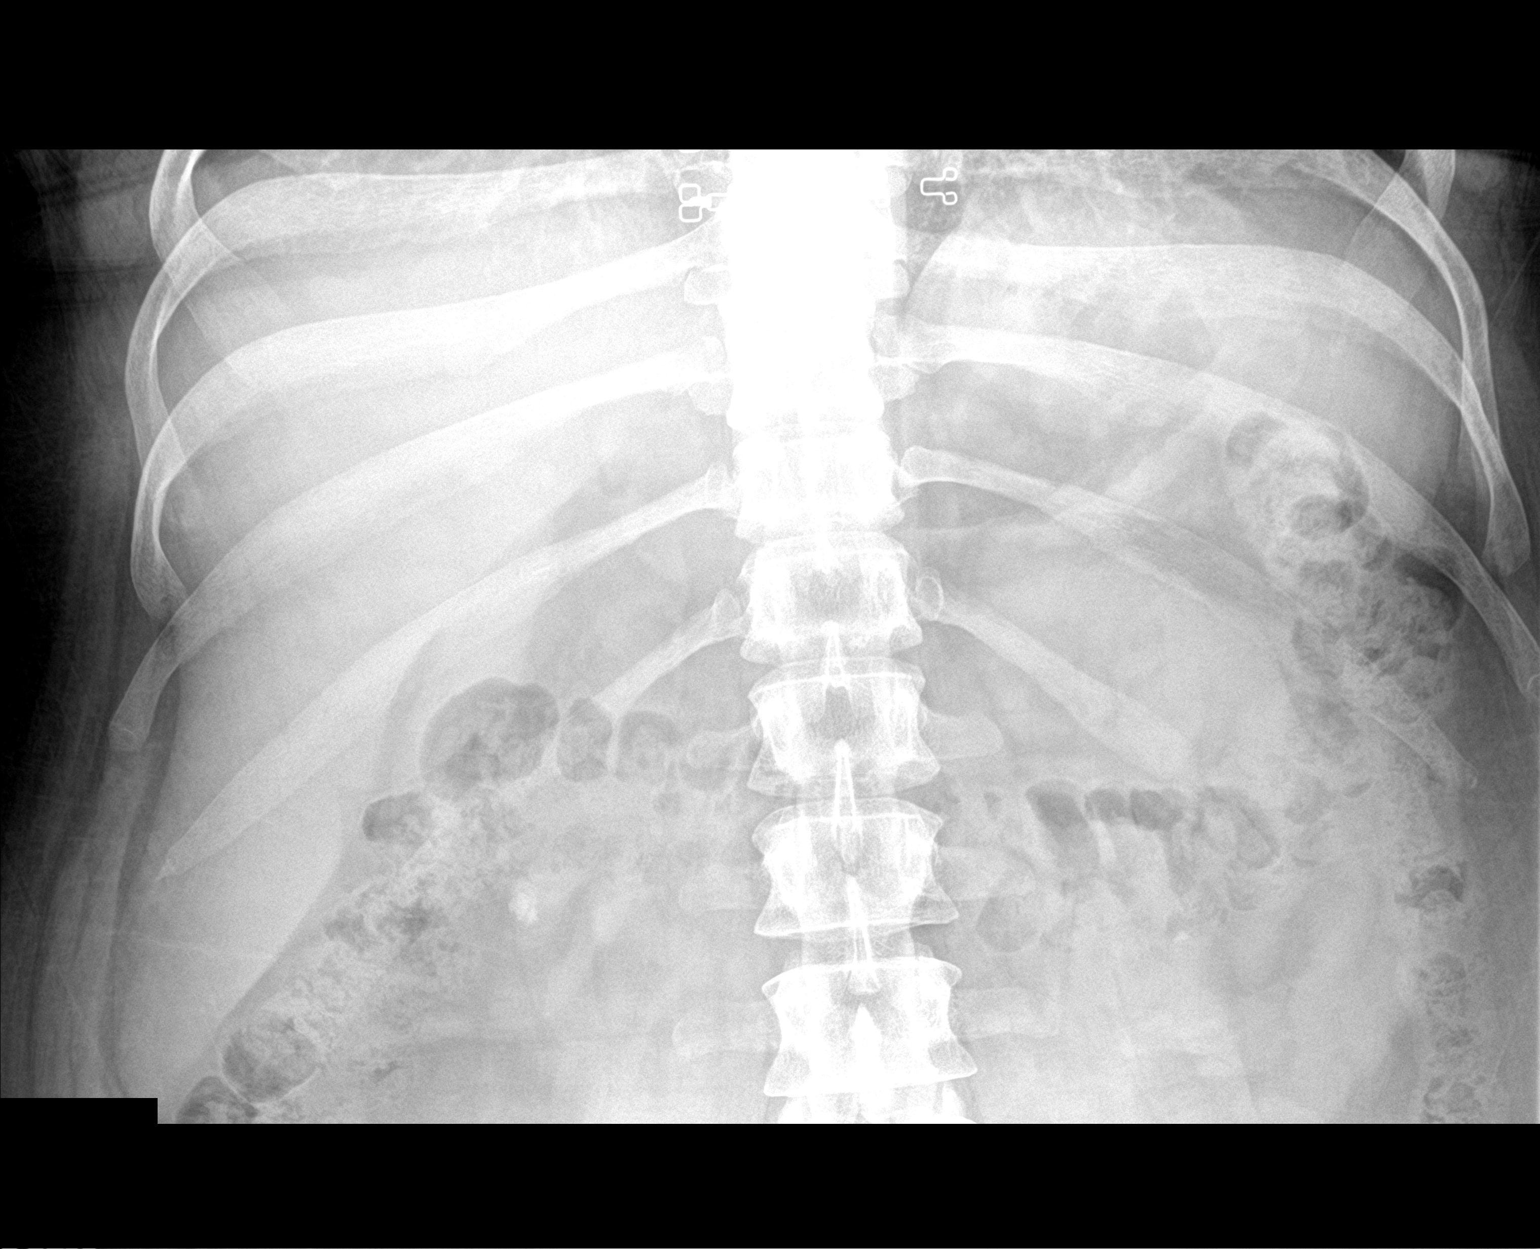

[3 of 3 positions shown; findings below may reference images not displayed]

FINDINGS: They are is a calcification immediately lateral to the distal sacrum
on the left measuring 8 x 5 mm. An adjacent calcification measures 6
x 4 mm. Suspect distal ureteral calculi with the larger calculus
similar in position to recent CT.

There is a calculus in the lower pole of the right kidney measuring
9 x 8 mm. There is a 3 mm calculus in the lower pole left kidney.

There is fairly diffuse stool throughout the colon. There is no
bowel dilatation or air-fluid level to suggest bowel obstruction. No
free air. Visualized lung bases are clear.
IMPRESSION: There are 2 adjacent calculi just lateral to the distal left sacrum,
probably both located in the distal left ureter. Intrarenal calculi
bilaterally, larger on the right than on the left. Fairly diffuse
stool noted in colon. No bowel obstruction or free air evident.

## 2021-06-04 ENCOUNTER — Ambulatory Visit: Payer: Self-pay | Admitting: Urology

## 2021-06-10 ENCOUNTER — Ambulatory Visit: Payer: Self-pay | Admitting: Urology

## 2021-06-25 ENCOUNTER — Ambulatory Visit
Admission: RE | Admit: 2021-06-25 | Discharge: 2021-06-25 | Disposition: A | Discharge status: Home / Self Care | Payer: BC Managed Care – PPO | Source: Ambulatory Visit | Attending: Urology | Admitting: Urology

## 2021-06-25 ENCOUNTER — Ambulatory Visit (INDEPENDENT_AMBULATORY_CARE_PROVIDER_SITE_OTHER): Payer: BC Managed Care – PPO | Admitting: Urology

## 2021-06-25 ENCOUNTER — Other Ambulatory Visit: Payer: Self-pay

## 2021-06-25 ENCOUNTER — Encounter: Payer: Self-pay | Admitting: Urology

## 2021-06-25 ENCOUNTER — Other Ambulatory Visit: Payer: Self-pay | Admitting: *Deleted

## 2021-06-25 VITALS — BP 125/83 | HR 64 | Ht 63.0 in | Wt 235.0 lb

## 2021-06-25 DIAGNOSIS — N200 Calculus of kidney: Secondary | ICD-10-CM | POA: Diagnosis present

## 2021-06-25 NOTE — Patient Instructions (Signed)
Dietary Guidelines to Help Prevent Kidney Stones Kidney stones are deposits of minerals and salts that form inside your kidneys. Your risk of developing kidney stones may be greater depending on your diet, your lifestyle, the medicines you take, and whether you have certain medical conditions. Most people can lower their chances of developing kidney stones by following the instructions below. Your dietitian may give you more specific instructions depending on your overall health and the type of kidney stones you tend to develop. What are tips for following this plan? Reading food labels  Choose foods with "no salt added" or "low-salt" labels. Limit your salt (sodium) intake to less than 1,500 mg a day. Choose foods with calcium for each meal and snack. Try to eat about 300 mg of calcium at each meal. Foods that contain 200-500 mg of calcium a serving include: 8 oz (237 mL) of milk, calcium-fortifiednon-dairy milk, and calcium-fortifiedfruit juice. Calcium-fortified means that calcium has been added to these drinks. 8 oz (237 mL) of kefir, yogurt, and soy yogurt. 4 oz (114 g) of tofu. 1 oz (28 g) of cheese. 1 cup (150 g) of dried figs. 1 cup (91 g) of cooked broccoli. One 3 oz (85 g) can of sardines or mackerel. Most people need 1,000-1,500 mg of calcium a day. Talk to your dietitian about how much calcium is recommended for you. Shopping Buy plenty of fresh fruits and vegetables. Most people do not need to avoid fruits and vegetables, even if these foods contain nutrients that may contribute to kidney stones. When shopping for convenience foods, choose: Whole pieces of fruit. Pre-made salads with dressing on the side. Low-fat fruit and yogurt smoothies. Avoid buying frozen meals or prepared deli foods. These can be high in sodium. Look for foods with live cultures, such as yogurt and kefir. Choose high-fiber grains, such as whole-wheat breads, oat bran, and wheat cereals. Cooking Do not add  salt to food when cooking. Place a salt shaker on the table and allow each person to add his or her own salt to taste. Use vegetable protein, such as beans, textured vegetable protein (TVP), or tofu, instead of meat in pasta, casseroles, and soups. Meal planning Eat less salt, if told by your dietitian. To do this: Avoid eating processed or pre-made food. Avoid eating fast food. Eat less animal protein, including cheese, meat, poultry, or fish, if told by your dietitian. To do this: Limit the number of times you have meat, poultry, fish, or cheese each week. Eat a diet free of meat at least 2 days a week. Eat only one serving each day of meat, poultry, fish, or seafood. When you prepare animal protein, cut pieces into small portion sizes. For most meat and fish, one serving is about the size of the palm of your hand. Eat at least five servings of fresh fruits and vegetables each day. To do this: Keep fruits and vegetables on hand for snacks. Eat one piece of fruit or a handful of berries with breakfast. Have a salad and fruit at lunch. Have two kinds of vegetables at dinner. Limit foods that are high in a substance called oxalate. These include: Spinach (cooked), rhubarb, beets, sweet potatoes, and Swiss chard. Peanuts. Potato chips, french fries, and baked potatoes with skin on. Nuts and nut products. Chocolate. If you regularly take a diuretic medicine, make sure to eat at least 1 or 2 servings of fruits or vegetables that are high in potassium each day. These include: Avocado. Banana. Orange, prune,   carrot, or tomato juice. Baked potato. Cabbage. Beans and split peas. Lifestyle  Drink enough fluid to keep your urine pale yellow. This is the most important thing you can do. Spread your fluid intake throughout the day. If you drink alcohol: Limit how much you use to: 0-1 drink a day for women who are not pregnant. 0-2 drinks a day for men. Be aware of how much alcohol is in your  drink. In the [place] one drink equals one 12 oz bottle of beer (355 mL), one 5 oz glass of wine (148 mL), or one 1 oz glass of hard liquor (44 mL). Lose weight if told by your health care provider. Work with your dietitian to find an eating plan and weight loss strategies that work best for you. General information Talk to your health care provider and dietitian about taking daily supplements. You may be told the following depending on your health and the cause of your kidney stones: Not to take supplements with vitamin C. To take a calcium supplement. To take a daily probiotic supplement. To take other supplements such as magnesium, fish oil, or vitamin B6. Take over-the-counter and prescription medicines only as told by your health care provider. These include supplements. What foods should I limit? Limit your intake of the following foods, or eat them as told by your dietitian. Vegetables Spinach. Rhubarb. Beets. Canned vegetables. Pickles. Olives. Baked potatoes with skin. Grains Wheat bran. Baked goods. Salted crackers. Cereals high in sugar. Meats and other proteins Nuts. Nut butters. Large portions of meat, poultry, or fish. Salted, precooked, or cured meats, such as sausages, meat loaves, and hot dogs. Dairy Cheese. Beverages Regular soft drinks. Regular vegetable juice. Seasonings and condiments Seasoning blends with salt. Salad dressings. Soy sauce. Ketchup. Barbecue sauce. Other foods Canned soups. Canned pasta sauce. Casseroles. Pizza. Lasagna. Frozen meals. Potato chips. French fries. The items listed above may not be a complete list of foods and beverages you should limit. Contact a dietitian for more information. What foods should I avoid? Talk to your dietitian about specific foods you should avoid based on the type of kidney stones you have and your overall health. Fruits Grapefruit. The item listed above may not be a complete list of foods and beverages you should  avoid. Contact a dietitian for more information. Summary Kidney stones are deposits of minerals and salts that form inside your kidneys. You can lower your risk of kidney stones by making changes to your diet. The most important thing you can do is drink enough fluid. Drink enough fluid to keep your urine pale yellow. Talk to your dietitian about how much calcium you should have each day, and eat less salt and animal protein as told by your dietitian. This information is not intended to replace advice given to you by your health care provider. Make sure you discuss any questions you have with your health care provider. Document Revised: 08/23/2019 Document Reviewed: 08/23/2019 Elsevier Patient Education  2022 Elsevier Inc.  

## 2021-06-25 NOTE — Progress Notes (Signed)
   06/25/2021 11:46 AM   Taylor Mercer Shelda Altes 1986/04/16 [id number]  Reason for visit: Follow up nephrolithiasis  HPI: 34 year old female with morbid obesity and BMI of 42 who underwent left ureteroscopy, laser lithotripsy, and stent placement in June 2020 for left distal ureteral stones, and right shockwave lithotripsy in September 2020 for a right renal stone.  She has not had any problems since that time.  She did not tolerate her ureteral stent well.  She denies any stone episodes over the last year.  I personally viewed and interpreted her KUB today which shows stable small stone fragments in the lower poles bilaterally.  We discussed general stone prevention strategies including adequate hydration with goal of producing 2.5 L of urine daily, increasing citric acid intake, increasing calcium intake during high oxalate meals, minimizing animal protein, and decreasing salt intake. Information about dietary recommendations given today.   She would like to follow-up as needed, return precautions discussed at length   Sondra Come, MD  [facility] [address], [address] [place], [place] [address] [phone]

## 2021-11-05 IMAGING — CT CT RENAL STONE PROTOCOL
2 of 4 series · 15 of 46 positions shown, 17 images · non-contrast
Comparison: March 01, 2019.

CLINICAL DATA: Right flank and lower quadrant pain.  Hematuria

EXAM:
CT ABDOMEN AND PELVIS WITHOUT CONTRAST
TECHNIQUE: Multidetector CT imaging of the abdomen and pelvis was performed
following the standard protocol without oral or IV contrast.

[Series 2: stone full standard · axial · 0.80mm/px · z∈[-417,-12]mm · 12 of 93 slices shown, 14 images]
[im 8/93  soft-tissue]
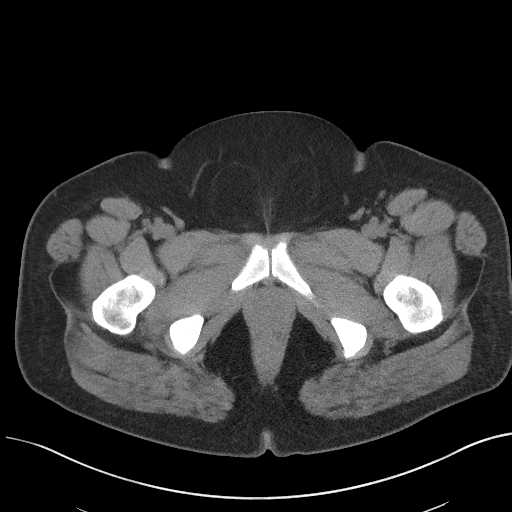
[im 8/93  bone]
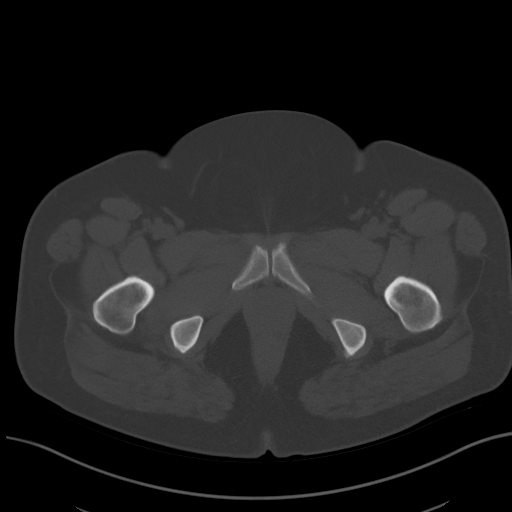
[im 15/93  soft-tissue]
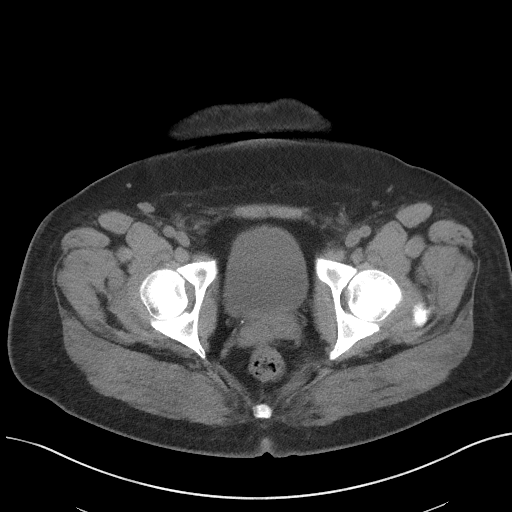
[im 23/93  soft-tissue]
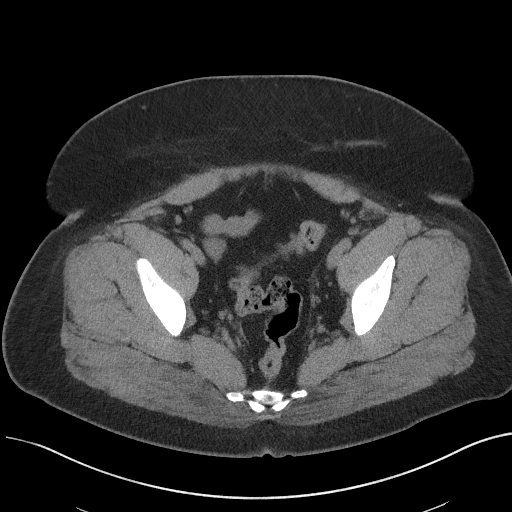
[im 30/93  soft-tissue]
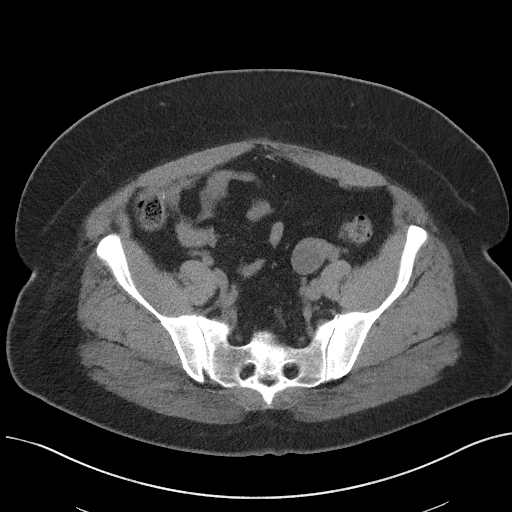
[im 37/93  soft-tissue]
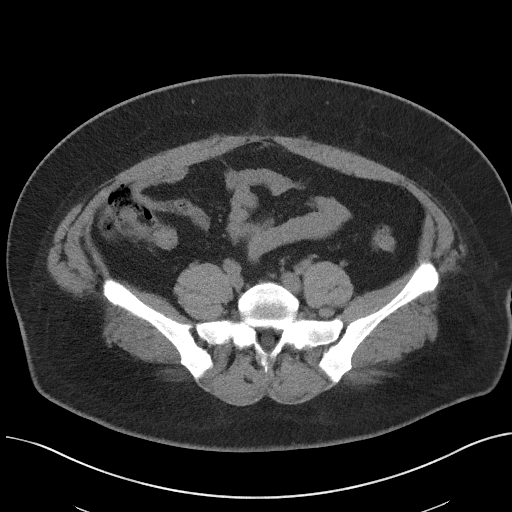
[im 45/93  soft-tissue]
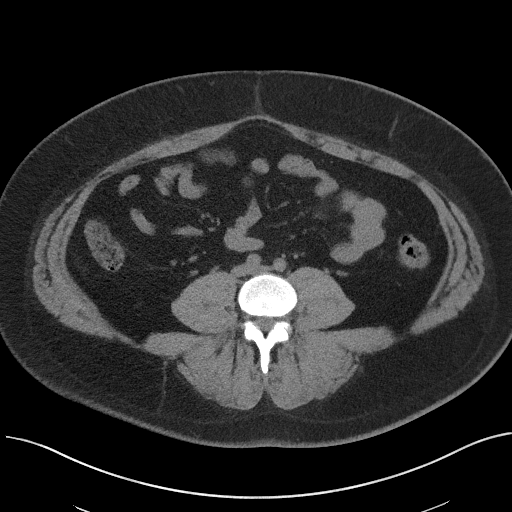
[im 52/93  soft-tissue]
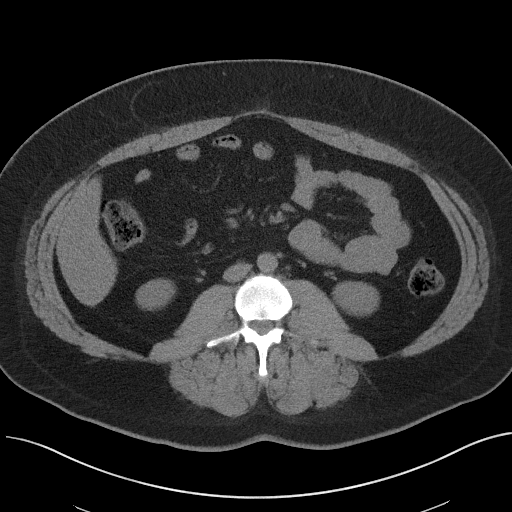
[im 59/93  soft-tissue]
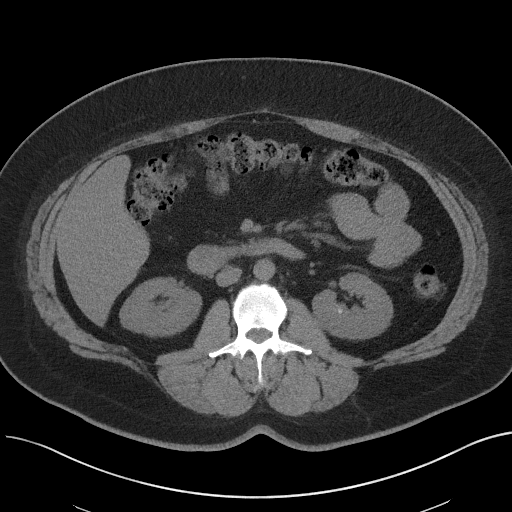
[im 67/93  soft-tissue]
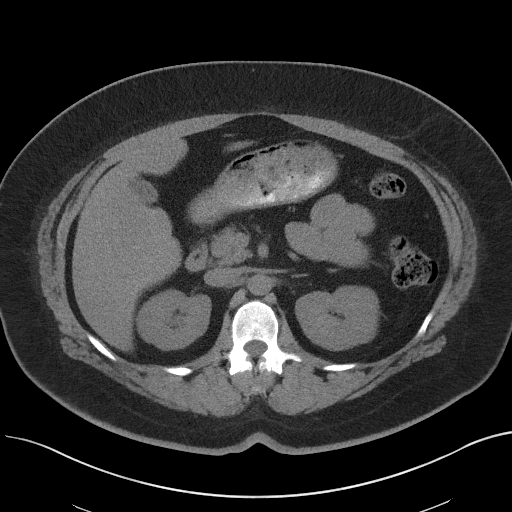
[im 67/93  bone]
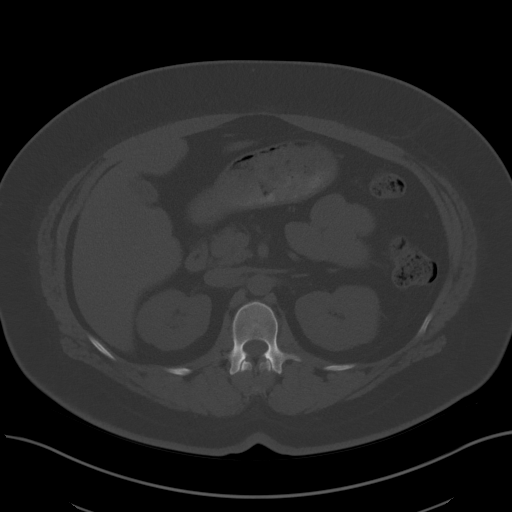
[im 74/93  soft-tissue]
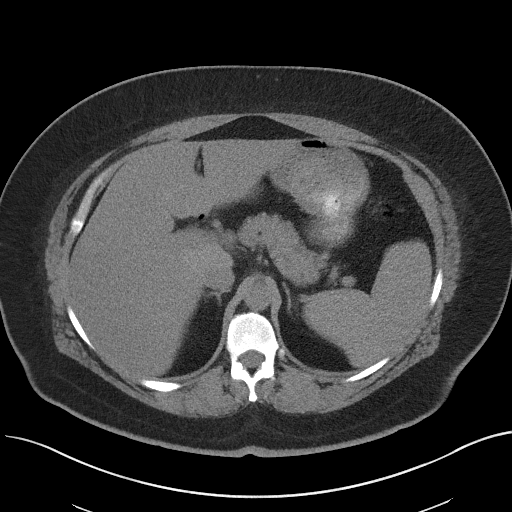
[im 81/93  soft-tissue]
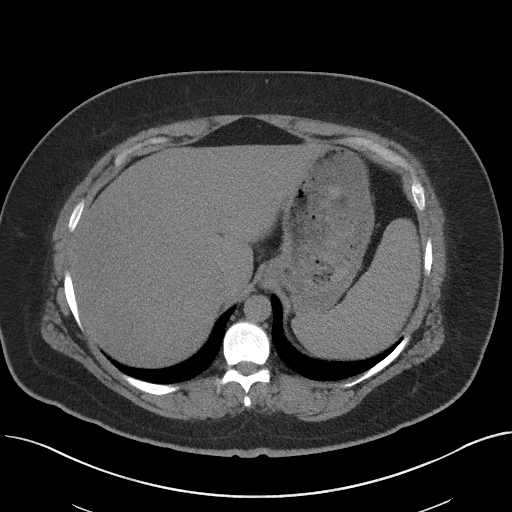
[im 89/93  soft-tissue]
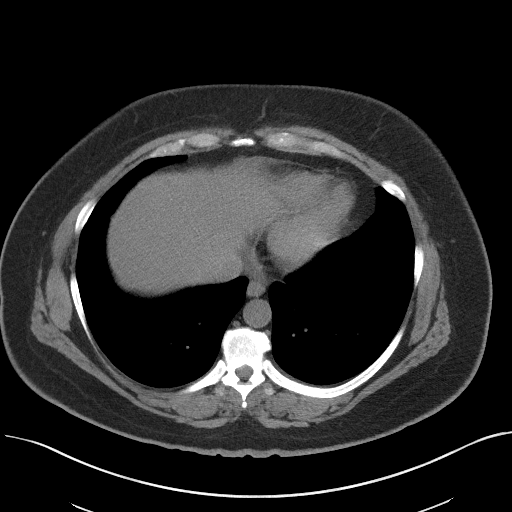

[Series 5: coronal · coronal · 0.89mm/px · 3 of 163 slices shown]
[im 55/163  soft-tissue]
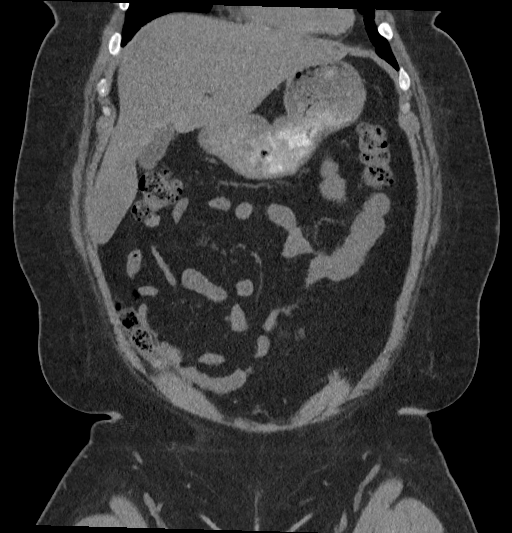
[im 73/163  soft-tissue]
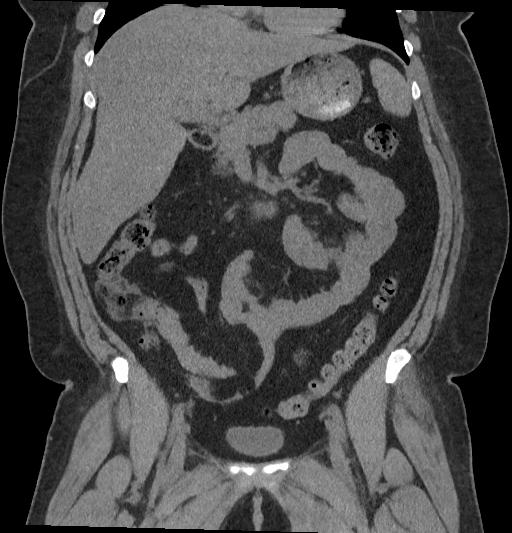
[im 91/163  soft-tissue]
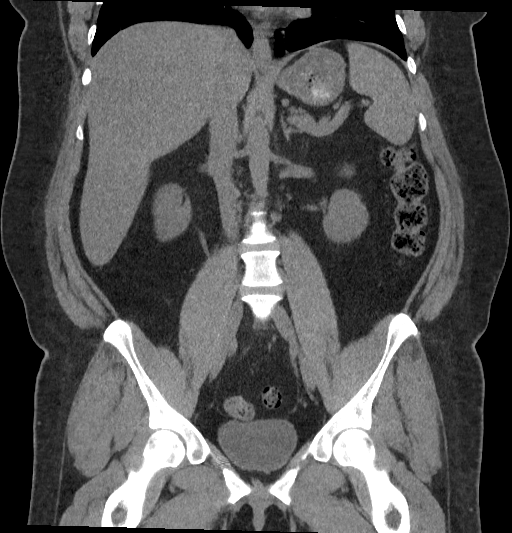

[15 of 46 positions shown; findings below may reference images not displayed]

FINDINGS: Lower chest: Lung bases are clear.

Hepatobiliary: There is hepatic steatosis. No focal liver lesions
are evident on this noncontrast enhanced study. Liver measures
cm in length. Gallbladder wall is not appreciably thickened. There
is no biliary duct dilatation.

Pancreas: There is no pancreatic mass or inflammatory focus.

Spleen: Spleen measures 13.5 x 11.4 x 6.9 cm with a measured splenic
volume of 531 cubic cm. No focal splenic lesions are evident.

Adrenals/Urinary Tract: Adrenals bilaterally appear normal. There is
slight hydronephrosis on the right. There is no appreciable
hydronephrosis on the left. There is no renal mass on either side.
There is a calculus in the lower pole of the right kidney measuring
9 x 6 mm. There is a nearby 2 mm calculus in the lower pole the
right kidney. On the left, there is a 4 mm calculus in the lower
pole region. There is a 2 mm calculus in the mid left kidney. On the
right, there are two distal ureteral calculi, edge a sent to each
other. The larger of these calculi is immediately proximal to the
ureterovesical junction measuring 4 x 3 mm. The smaller calculus
measures 2 x 2 mm which is immediately superior to the larger
calculus. No other ureteral calculi are evident. Urinary bladder is
midline with wall thickness within normal limits.

Stomach/Bowel: There are scattered sigmoid diverticula without
diverticulitis. There is moderate stool in the colon. There is no
appreciable bowel wall or mesenteric thickening. There is no evident
bowel obstruction. The terminal ileum appears unremarkable. There is
no evident free air or portal venous air.

Vascular/Lymphatic: There is no abdominal aortic aneurysm. No
vascular lesions are evident on this noncontrast enhanced study.
There is no adenopathy by size criteria in the abdomen or pelvis.
There are scattered subcentimeter mesenteric and inguinal lymph
nodes.

Reproductive: Uterus is absent.  There is no evident pelvic mass.

Other: Appendix is absent. There is no periappendiceal region
inflammation. There is no abscess or ascites in the abdomen or
pelvis. There is mild fat in the umbilicus.

Musculoskeletal: No blastic or lytic bone lesions are evident. There
is no intramuscular lesion.
IMPRESSION: 1. There are two distal right ureteral calculi measuring 4 x 3 and 2
x 2 mm respectively. There is slight hydronephrosis on the right.

2.  There are nonobstructing calculi in each kidney.

3. Prominent liver and prominent spleen. No focal liver or splenic
lesions evident. There is hepatic steatosis.

4. No bowel obstruction. Occasional sigmoid diverticula without
diverticulitis. No abscess in the abdomen or pelvis. Appendix is
absent. No periappendiceal region inflammation evident.

5.  Uterus absent.

These results will be called to the ordering clinician or
representative by the Radiologist Assistant, and communication
documented in the PACS or zVision Dashboard.

## 2023-03-30 ENCOUNTER — Ambulatory Visit (INDEPENDENT_AMBULATORY_CARE_PROVIDER_SITE_OTHER)
Admit: 2023-03-30 | Discharge: 2023-03-30 | Disposition: A | Discharge status: Home / Self Care | Payer: BC Managed Care – PPO | Attending: Family Medicine | Admitting: Family Medicine

## 2023-03-30 ENCOUNTER — Encounter: Payer: Self-pay | Admitting: Emergency Medicine

## 2023-03-30 ENCOUNTER — Ambulatory Visit
Admission: EM | Admit: 2023-03-30 | Discharge: 2023-03-30 | Disposition: A | Discharge status: Home / Self Care | Payer: BC Managed Care – PPO | Attending: Family Medicine | Admitting: Family Medicine

## 2023-03-30 DIAGNOSIS — M79604 Pain in right leg: Secondary | ICD-10-CM

## 2023-03-30 NOTE — ED Triage Notes (Signed)
Pt presents with right shin tingling and burning sensation x 4 days.

## 2023-03-30 NOTE — Discharge Instructions (Addendum)
Return to the main entrance at 3:45 PM for your ultrasound at 4 PM.  I will call you with results.   If you do not have a blood clot, follow-up with your primary care doctor about your lower leg burning, as you may have neuropathy.

## 2023-03-30 NOTE — ED Notes (Signed)
Spoke with radiology scheduling to schedule ultrasound, patient added onto schedule.

## 2023-03-30 NOTE — ED Provider Notes (Signed)
[facility]    CSN: [id number] Arrival date & time: 03/30/23  0934      History   Chief Complaint Chief Complaint  Patient presents with   Leg Problem     HPI  HPI Taylor Mercer is a 37 y.o. female.   Taylor Mercer presents for burning lower right leg pain that started 4 days ago. Believes something bit her as she has 2 marks on her leg the same day that her leg started hurting. No known insect bites and says she hasn't been outside. Been taking doxycyline for the past 2 weeks and has about 2 weeks left. No fever. She has never had anything like this before.   No fall or trauma. Has no knee or hip pain.    She has some "butterflies in her chest." Denies chest pain or shortness of breath. No recent travel, surgeries, or hx of PE or  DVT. Not taking any estrogen containing products. Had a hysterectomy a while ago.   She is on Ozempic and wants to be sure she doesn't have a blood clot. Her PCP told her to come here to be sure she doesn't have a clot. Took some ibuprofen and put some ice on it.      Past Medical History:  Diagnosis Date   Anemia    WITH PREGNANCY   Complication of anesthesia    Endometriosis    Family history of adverse reaction to anesthesia    DAD-N/V   GERD (gastroesophageal reflux disease)    OCC   Heart murmur    AS A CHILD-ASYMPTOMATIC   Hypertension    Kidney stones    Kidney stones    Migraine    MIGRAINES   PONV (postoperative nausea and vomiting)     Patient Active Problem List   Diagnosis Date Noted   Kidney stones 08/08/2019   Left wrist pain 07/30/2019   Allergic rhinitis 09/09/2017   Fever 08/01/2017   Mono exposure 08/01/2017   Other fatigue 08/01/2017   Dysfunction of right eustachian tube 02/25/2017   Psoriasis 01/03/2017   Lower abdominal pain 06/18/2016   Lymphadenopathy 02/23/2016   Adjustment disorder with anxious mood 12/18/2015   Acute recurrent maxillary sinusitis 10/20/2015   Morbid (severe) obesity due to  excess calories (HCC) 10/20/2015   Right upper quadrant abdominal pain 10/20/2015   Skin tag 10/20/2015   Lumbar spine strain 02/07/2014   Pelvic and perineal pain 02/28/2013   Migraine, unspecified, not intractable, without status migrainosus 12/25/2012   High blood pressure 10/12/2011    Past Surgical History:  Procedure Laterality Date   ABDOMINAL HYSTERECTOMY     APPENDECTOMY     CESAREAN SECTION     X2   CYST EXCISION     CYSTOSCOPY/URETEROSCOPY/HOLMIUM LASER/STENT PLACEMENT Left 03/08/2019   Procedure: CYSTOSCOPY/URETEROSCOPY/HOLMIUM LASER/STENT PLACEMENT;  Surgeon: Sondra Come, MD;  Location: [facility];  Service: Urology;  Laterality: Left;   EXTRACORPOREAL SHOCK WAVE LITHOTRIPSY Right 05/17/2019   Procedure: EXTRACORPOREAL SHOCK WAVE LITHOTRIPSY (ESWL);  Surgeon: Sondra Come, MD;  Location: [facility];  Service: Urology;  Laterality: Right;    OB History   No obstetric history on file.      Home Medications    Prior to Admission medications   Medication Sig Start Date End Date Taking? Authorizing Provider  chlorthalidone (HYGROTON) 25 MG tablet Take 1 tablet (25 mg total) by mouth daily. 07/16/15  Yes Jeanmarie Plant, MD  doxycycline (VIBRA-TABS) 100 MG tablet Take by  mouth. 03/10/23  Yes [provider]  omeprazole (PRILOSEC) 20 MG capsule Take 20 mg by mouth daily as needed.  07/31/19  Yes [provider]  propranolol (INDERAL) 10 MG tablet Take 10 mg by mouth 2 (two) times a day. 02/18/19  Yes [provider]  SUMAtriptan (IMITREX) 50 MG tablet Take 50 mg by mouth every 2 (two) hours as needed for migraine or headache.    Yes [provider]  fluticasone (FLONASE) 50 MCG/ACT nasal spray Place 1 spray into both nostrils daily as needed for allergies.  08/04/19   [provider]  OZEMPIC, 2 MG/DOSE, 8 MG/3ML SOPN Inject into the skin. 06/10/23   [provider]    Family History Family History  Problem Relation  Age of Onset   Hypertension Mother    Cancer Father     Social History Social History   Tobacco Use   Smoking status: Never   Smokeless tobacco: Never  Vaping Use   Vaping status: Never Used  Substance Use Topics   Alcohol use: No   Drug use: No     Allergies   Patient has no known allergies.   Review of Systems Review of Systems: :negative unless otherwise stated in HPI.      Physical Exam Triage Vital Signs ED Triage Vitals  Encounter Vitals Group     BP      Systolic BP Percentile      Diastolic BP Percentile      Pulse      Resp      Temp      Temp src      SpO2      Weight      Height      Head Circumference      Peak Flow      Pain Score      Pain Loc      Pain Education      Exclude from Growth Chart    No data found.  Updated Vital Signs BP (!) 134/92 (BP Location: Left Arm)   Pulse 70   Temp 98.4 F (36.9 C) (Oral)   Resp 16   SpO2 96%   Visual Acuity Right Eye Distance:   Left Eye Distance:   Bilateral Distance:    Right Eye Near:   Left Eye Near:    Bilateral Near:     Physical Exam GEN: well appearing female in no acute distress  CVS: well perfused  RESP: speaking in full sentences without pause, no respiratory distress  MSK: LE No evidence of bony deformity, asymmetry, or muscle atrophy. Right calf tender to palpation, no noticeable edema or erythema, 2 hyperpigmented marks on the anterior mid-lower shin Left calf nontender to palpation Full passive range of motion of knee and ankle Strength 5/5  Sensation intact.  Equal DP pulses  UC Treatments / Results  Labs (all labs ordered are listed, but only abnormal results are displayed) Labs Reviewed - No data to display  EKG   Radiology US Venous Img Lower Unilateral Right  Result Date: 03/30/2023 CLINICAL DATA:  Right shin pain, burning and tingling EXAM: RIGHT LOWER EXTREMITY VENOUS DOPPLER ULTRASOUND TECHNIQUE: Gray-scale sonography with compression, as well as  color and duplex ultrasound, were performed to evaluate the deep venous system(s) from the level of the common femoral vein through the popliteal and proximal calf veins. COMPARISON:  None Available. FINDINGS: VENOUS Normal compressibility of the common femoral, superficial femoral, and popliteal veins, as  well as the visualized calf veins. Visualized portions of profunda femoral vein and great saphenous vein unremarkable. No filling defects to suggest DVT on grayscale or color Doppler imaging. Doppler waveforms show normal direction of venous flow, normal respiratory plasticity and response to augmentation. Limited views of the contralateral common femoral vein are unremarkable. OTHER None. Limitations: none IMPRESSION: Negative. Electronically Signed   By: Malachy Moan M.D.   On: 03/30/2023 16:49     Procedures Procedures (including critical care time)  Medications Ordered in UC Medications - No data to display  Initial Impression / Assessment and Plan / UC Course  I have reviewed the triage vital signs and the nursing notes.  Pertinent labs & imaging results that were available during my care of the patient were reviewed by me and considered in my medical decision making (see chart for details).      Pt is a 37 y.o.  female with 4 days of right burning sensation in her shin.  She is concerned that she has a blood clot as she is on Ozempic.  On exam, pt has tenderness at right posterior calf concerning for DVT however there is no appreciable leg circumference difference.  Ordered stat DVT of right lower extremity.  Patient to follow-up here at 4:00 PM  for DVT ultrasound.  She will follow-up with her primary care provider if this is negative as I suspect she may have peripheral neuropathy.  Take over-the-counter analgesics as needed for pain for now.  Stat DVT ultrasound of the right lower extremity was negative for acute DVT.  Patient called and updated on results.  She will follow-up with  her primary care provider for further evaluation.    ED precautions given. Understanding voiced. Discussed MDM, treatment plan and plan for follow-up with patient who agrees with plan.   Final Clinical Impressions(s) / UC Diagnoses   Final diagnoses:  Acute leg pain, right     Discharge Instructions      Return to the main entrance at 3:45 PM for your ultrasound at 4 PM.  I will call you with results.   If you do not have a blood clot, follow-up with your primary care doctor about your lower leg burning, as you may have neuropathy.        ED Prescriptions   None    PDMP not reviewed this encounter.   Katha Cabal, DO 03/31/23 1610

## 2023-07-25 ENCOUNTER — Ambulatory Visit
Admission: EM | Admit: 2023-07-25 | Discharge: 2023-07-25 | Disposition: A | Discharge status: Home / Self Care | Payer: BC Managed Care – PPO | Attending: Emergency Medicine | Admitting: Emergency Medicine

## 2023-07-25 DIAGNOSIS — U071 COVID-19: Secondary | ICD-10-CM | POA: Insufficient documentation

## 2023-07-25 DIAGNOSIS — R079 Chest pain, unspecified: Secondary | ICD-10-CM | POA: Diagnosis not present

## 2023-07-25 HISTORY — DX: Type 2 diabetes mellitus without complications: E11.9

## 2023-07-25 LAB — SARS CORONAVIRUS 2 BY RT PCR: SARS Coronavirus 2 by RT PCR: POSITIVE — AB

## 2023-07-25 MED ORDER — IPRATROPIUM BROMIDE 0.06 % NA SOLN: 2.0000 | Freq: Four times a day (QID) | NASAL | 12 refills | Status: AC

## 2023-07-25 MED ORDER — BENZONATATE 100 MG PO CAPS: 200.0000 mg | ORAL_CAPSULE | Freq: Three times a day (TID) | ORAL | 0 refills | Status: AC

## 2023-07-25 MED ORDER — PROMETHAZINE-DM 6.25-15 MG/5ML PO SYRP: 5.0000 mL | ORAL_SOLUTION | Freq: Four times a day (QID) | ORAL | 0 refills | Status: AC | PRN

## 2023-07-25 NOTE — Discharge Instructions (Addendum)
CDC guidelines state that you must wear a mask for the first 5 days of symptoms when you are around other people.  After 5 days you no longer need to mask as you are no longer considered infectious.  There is no longer need to quarantine unless you have a fever.  If you do have a fever then you need to quarantine until you have been fever free for 24 hours without taking Tylenol and/or ibuprofen.  Use over-the-counter Tylenol and/or ibuprofen according to the package instructions as needed for fever and pain.  Use the Atrovent nasal spray, 2 squirts up each nostril every 6 hours, as needed for nasal congestion and runny nose.  Use the Tessalon Perles every 8 hours during the day as needed for cough.  Take them with a small sip of water.  You may experience numbness to the base of your tongue or metallic taste in her mouth, this is normal.  Use the Promethazine DM cough syrup at bedtime as needed for cough and congestion.  Be mindful this medication will make you sleepy.  If you have been prescribed Paxlovid take it twice daily as prescribed for 5 days.  If you develop any worsening respiratory symptoms such as shortness of breath, shortness of breath at rest, feel as though you cannot catch your breath, you are unable to speak in full sentences, or, as a late sign, your lips begin turning blue you need to call 911 and go to the ER for evaluation.

## 2023-07-25 NOTE — ED Provider Notes (Signed)
[facility]    CSN: [id number] Arrival date & time: 07/25/23  0915      History   Chief Complaint No chief complaint on file.   HPI Taylor Mercer is a 37 y.o. female.   HPI  37 year old female with past medical history significant for migraines, kidney stones, heart murmur, GERD, and diabetes presents for evaluation of respiratory symptoms that started yesterday.  She took a home COVID test this morning and tested positive.  Her daughter is also testing COVID-positive and he she has had symptoms for the last 5 days.  Patient denies any fever, cough, shortness of breath.  She is endorsing headache, body aches, sore throat, fatigue, and intermittent midsternal chest pain.  Past Medical History:  Diagnosis Date   Anemia    WITH PREGNANCY   Complication of anesthesia    Diabetes mellitus without complication (HCC)    Endometriosis    Family history of adverse reaction to anesthesia    DAD-N/V   GERD (gastroesophageal reflux disease)    OCC   Heart murmur    AS A CHILD-ASYMPTOMATIC   Hypertension    Kidney stones    Kidney stones    Migraine    MIGRAINES   PONV (postoperative nausea and vomiting)     Patient Active Problem List   Diagnosis Date Noted   Kidney stones 08/08/2019   Left wrist pain 07/30/2019   Allergic rhinitis 09/09/2017   Fever 08/01/2017   Mono exposure 08/01/2017   Other fatigue 08/01/2017   Dysfunction of right eustachian tube 02/25/2017   Psoriasis 01/03/2017   Lower abdominal pain 06/18/2016   Lymphadenopathy 02/23/2016   Adjustment disorder with anxious mood 12/18/2015   Acute recurrent maxillary sinusitis 10/20/2015   Morbid (severe) obesity due to excess calories (HCC) 10/20/2015   Right upper quadrant abdominal pain 10/20/2015   Skin tag 10/20/2015   Lumbar spine strain 02/07/2014   Pelvic and perineal pain 02/28/2013   Migraine, unspecified, not intractable, without status migrainosus 12/25/2012   High blood  pressure 10/12/2011    Past Surgical History:  Procedure Laterality Date   ABDOMINAL HYSTERECTOMY     APPENDECTOMY     CESAREAN SECTION     X2   CYST EXCISION     CYSTOSCOPY/URETEROSCOPY/HOLMIUM LASER/STENT PLACEMENT Left 03/08/2019   Procedure: CYSTOSCOPY/URETEROSCOPY/HOLMIUM LASER/STENT PLACEMENT;  Surgeon: Sondra Come, MD;  Location: [facility];  Service: Urology;  Laterality: Left;   EXTRACORPOREAL SHOCK WAVE LITHOTRIPSY Right 05/17/2019   Procedure: EXTRACORPOREAL SHOCK WAVE LITHOTRIPSY (ESWL);  Surgeon: Sondra Come, MD;  Location: [facility];  Service: Urology;  Laterality: Right;    OB History   No obstetric history on file.      Home Medications    Prior to Admission medications   Medication Sig Start Date End Date Taking? Authorizing Provider  benzonatate (TESSALON) 100 MG capsule Take 2 capsules (200 mg total) by mouth every 8 (eight) hours. 07/25/23  Yes Becky Augusta, NP  ipratropium (ATROVENT) 0.06 % nasal spray Place 2 sprays into both nostrils 4 (four) times daily. 07/25/23  Yes Becky Augusta, NP  promethazine-dextromethorphan (PROMETHAZINE-DM) 6.25-15 MG/5ML syrup Take 5 mLs by mouth 4 (four) times daily as needed. 07/25/23  Yes Becky Augusta, NP  chlorthalidone (HYGROTON) 25 MG tablet Take 1 tablet (25 mg total) by mouth daily. 07/16/15   Jeanmarie Plant, MD  doxycycline (VIBRA-TABS) 100 MG tablet Take by mouth. 03/10/23   [provider]  fluticasone (FLONASE) 50 MCG/ACT nasal spray  Place 1 spray into both nostrils daily as needed for allergies.  08/04/19   [provider]  omeprazole (PRILOSEC) 20 MG capsule Take 20 mg by mouth daily as needed.  07/31/19   [provider]  OZEMPIC, 2 MG/DOSE, 8 MG/3ML SOPN Inject into the skin. 06/10/23   [provider]  propranolol (INDERAL) 10 MG tablet Take 10 mg by mouth 2 (two) times a day. 02/18/19   [provider]  SUMAtriptan (IMITREX) 50 MG tablet Take 50 mg by mouth every 2  (two) hours as needed for migraine or headache.     [provider]    Family History Family History  Problem Relation Age of Onset   Hypertension Mother    Cancer Father     Social History Social History   Tobacco Use   Smoking status: Never   Smokeless tobacco: Never  Vaping Use   Vaping status: Never Used  Substance Use Topics   Alcohol use: No   Drug use: No     Allergies   Patient has no known allergies.   Review of Systems Review of Systems  Constitutional:  Negative for fever.  HENT:  Positive for congestion and sore throat. Negative for rhinorrhea.   Respiratory:  Negative for cough, shortness of breath and wheezing.   Cardiovascular:  Positive for chest pain.  Musculoskeletal:  Positive for arthralgias and myalgias.  Neurological:  Positive for headaches.     Physical Exam Triage Vital Signs ED Triage Vitals  Encounter Vitals Group     BP 07/25/23 0928 119/88     Systolic BP Percentile --      Diastolic BP Percentile --      Pulse Rate 07/25/23 0928 83     Resp --      Temp 07/25/23 0928 98.4 F (36.9 C)     Temp Source 07/25/23 0928 Oral     SpO2 07/25/23 0928 100 %     Weight 07/25/23 0931 195 lb (88.5 kg)     Height 07/25/23 0931 5\' 3"  (1.6 m)     Head Circumference --      Peak Flow --      Pain Score 07/25/23 0931 6     Pain Loc --      Pain Education --      Exclude from Growth Chart --    No data found.  Updated Vital Signs BP 119/88 (BP Location: Left Arm)   Pulse 83   Temp 98.4 F (36.9 C) (Oral)   Ht 5\' 3"  (1.6 m)   Wt 195 lb (88.5 kg)   SpO2 100%   BMI 34.54 kg/m   Visual Acuity Right Eye Distance:   Left Eye Distance:   Bilateral Distance:    Right Eye Near:   Left Eye Near:    Bilateral Near:     Physical Exam Vitals and nursing note reviewed.  Constitutional:      Appearance: Normal appearance. She is not ill-appearing.  HENT:     Head: Normocephalic and atraumatic.     Right Ear: Tympanic  membrane, ear canal and external ear normal. There is no impacted cerumen.     Left Ear: Tympanic membrane, ear canal and external ear normal. There is no impacted cerumen.     Nose: Congestion present. No rhinorrhea.     Comments: Nasal mucosa is erythematous and mildly edematous with out appreciable discharge.    Mouth/Throat:     Mouth: Mucous membranes are moist.  Pharynx: Oropharynx is clear. Posterior oropharyngeal erythema present. No oropharyngeal exudate.     Comments: Mild erythema to the posterior oropharynx without postnasal drip.  Tonsillar pillars and soft palate are unremarkable. Cardiovascular:     Rate and Rhythm: Normal rate and regular rhythm.     Pulses: Normal pulses.     Heart sounds: Normal heart sounds. No murmur heard.    No friction rub. No gallop.  Pulmonary:     Effort: Pulmonary effort is normal.     Breath sounds: Normal breath sounds. No wheezing, rhonchi or rales.     Comments: Patient does have tenderness with palpation of her sternum and costosternal joints. Chest:     Chest wall: Tenderness present.  Musculoskeletal:     Cervical back: Normal range of motion and neck supple. No tenderness.  Lymphadenopathy:     Cervical: No cervical adenopathy.  Skin:    General: Skin is warm and dry.     Capillary Refill: Capillary refill takes less than 2 seconds.     Findings: No rash.  Neurological:     General: No focal deficit present.     Mental Status: She is alert and oriented to person, place, and time.      UC Treatments / Results  Labs (all labs ordered are listed, but only abnormal results are displayed) Labs Reviewed  SARS CORONAVIRUS 2 BY RT PCR - Abnormal; Notable for the following components:      Result Value   SARS Coronavirus 2 by RT PCR POSITIVE (*)    All other components within normal limits    EKG Normal sinus rhythm with a ventricular rate of 73 bpm PR interval 150 ms QRS duration 74 ms QT/QTc 378/460 ms Low voltage QRS in  lead III but no ST or T wave abnormalities.  Radiology No results found.  Procedures Procedures (including critical care time)  Medications Ordered in UC Medications - No data to display  Initial Impression / Assessment and Plan / UC Course  I have reviewed the triage vital signs and the nursing notes.  Pertinent labs & imaging results that were available during my care of the patient were reviewed by me and considered in my medical decision making (see chart for details).   Patient is a nontoxic-appearing 37 year old female presenting for evaluation of respiratory symptoms in the setting of testing COVID-positive at home.  Her symptoms began yesterday and she took a COVID test this morning which was positive.  She is complaining of intermittent midsternal chest pain that is reproducible to palpation.  She cannot think of any provoking or alleviating symptoms.  Examination of her upper respiratory tract does reveal inflamed nasal mucosa without appreciable discharge and erythema to the posterior oropharynx.  No cervical lymphadenopathy present on exam.  Cardiopulmonary exam is benign.  I suspect that the patient's intermittent chest pain is actually costochondritis as a result of her COVID infection, however, I will obtain a twelve-lead EKG to evaluate for any cardiac abnormalities.  I will also order confirmatory COVID-19 PCR.  EKG shows normal sinus rhythm without ST or T wave abnormalities.  There is a low voltage QRS in lead III.  EKG was compared to 2 tracing from 03/07/2021 which does not show any significant changes.  COVID PCR is positive.  I will discharge patient on the diagnosis of COVID-19.  We discussed the current CDC guidelines of masking for the first 5 days of symptoms and no longer 5 days of immediate quarantine.  I will discharge her home on Atrovent nasal spray, Tessalon Perles, Promethazine DM cough syrup.  Patient symptoms are very mild and even though she has a comorbidity  of diabetes I do not feel that oral antivirals are necessary at this time.  Final Clinical Impressions(s) / UC Diagnoses   Final diagnoses:  COVID-19     Discharge Instructions      CDC guidelines state that you must wear a mask for the first 5 days of symptoms when you are around other people.  After 5 days you no longer need to mask as you are no longer considered infectious.  There is no longer need to quarantine unless you have a fever.  If you do have a fever then you need to quarantine until you have been fever free for 24 hours without taking Tylenol and/or ibuprofen.  Use over-the-counter Tylenol and/or ibuprofen according to the package instructions as needed for fever and pain.  Use the Atrovent nasal spray, 2 squirts up each nostril every 6 hours, as needed for nasal congestion and runny nose.  Use the Tessalon Perles every 8 hours during the day as needed for cough.  Take them with a small sip of water.  You may experience numbness to the base of your tongue or metallic taste in her mouth, this is normal.  Use the Promethazine DM cough syrup at bedtime as needed for cough and congestion.  Be mindful this medication will make you sleepy.  If you have been prescribed Paxlovid take it twice daily as prescribed for 5 days.  If you develop any worsening respiratory symptoms such as shortness of breath, shortness of breath at rest, feel as though you cannot catch your breath, you are unable to speak in full sentences, or, as a late sign, your lips begin turning blue you need to call 911 and go to the ER for evaluation.      ED Prescriptions     Medication Sig Dispense Auth. Provider   benzonatate (TESSALON) 100 MG capsule Take 2 capsules (200 mg total) by mouth every 8 (eight) hours. 21 capsule Becky Augusta, NP   ipratropium (ATROVENT) 0.06 % nasal spray Place 2 sprays into both nostrils 4 (four) times daily. 15 mL Becky Augusta, NP   promethazine-dextromethorphan  (PROMETHAZINE-DM) 6.25-15 MG/5ML syrup Take 5 mLs by mouth 4 (four) times daily as needed. 118 mL Becky Augusta, NP      PDMP not reviewed this encounter.   Becky Augusta, NP 07/25/23 1026

## 2023-07-25 NOTE — ED Triage Notes (Signed)
Patient states she tested positive for Covid at home, headache, sore throat, fatigue, body aches. Treated with Tylenol and Ibuprofen.
